# Patient Record
Sex: Male | Born: 1980 | Race: Black or African American | Hispanic: No | Marital: Married | Smoking: Never smoker
Health system: Southern US, Community
[De-identification: ages and names within clinical notes are randomized; demographics above are authoritative.]

## PROBLEM LIST (undated history)

## (undated) DIAGNOSIS — K1120 Sialoadenitis, unspecified: Secondary | ICD-10-CM

## (undated) HISTORY — PX: NO PAST SURGERIES: SHX2092

## (undated) HISTORY — DX: Sialoadenitis, unspecified: K11.20

---
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
## 2004-10-13 ENCOUNTER — Emergency Department (HOSPITAL_COMMUNITY): Admission: EM | Admit: 2004-10-13 | Discharge: 2004-10-14 | Payer: Self-pay | Admitting: Emergency Medicine

## 2008-09-23 ENCOUNTER — Emergency Department (HOSPITAL_COMMUNITY): Admission: EM | Admit: 2008-09-23 | Discharge: 2008-09-24 | Payer: Self-pay | Admitting: Emergency Medicine

## 2011-04-10 ENCOUNTER — Emergency Department (HOSPITAL_COMMUNITY): Payer: Self-pay

## 2011-04-10 ENCOUNTER — Emergency Department (HOSPITAL_COMMUNITY)
Admission: EM | Admit: 2011-04-10 | Discharge: 2011-04-10 | Disposition: A | Discharge status: Home / Self Care | Payer: Self-pay | Attending: Emergency Medicine | Admitting: Emergency Medicine

## 2011-04-10 DIAGNOSIS — W219XXA Striking against or struck by unspecified sports equipment, initial encounter: Secondary | ICD-10-CM | POA: Insufficient documentation

## 2011-04-10 DIAGNOSIS — S6980XA Other specified injuries of unspecified wrist, hand and finger(s), initial encounter: Secondary | ICD-10-CM | POA: Insufficient documentation

## 2011-04-10 DIAGNOSIS — Y9367 Activity, basketball: Secondary | ICD-10-CM | POA: Insufficient documentation

## 2011-04-10 DIAGNOSIS — M7989 Other specified soft tissue disorders: Secondary | ICD-10-CM | POA: Insufficient documentation

## 2011-04-10 DIAGNOSIS — M20029 Boutonniere deformity of unspecified finger(s): Secondary | ICD-10-CM | POA: Insufficient documentation

## 2011-04-10 DIAGNOSIS — M79609 Pain in unspecified limb: Secondary | ICD-10-CM | POA: Insufficient documentation

## 2011-04-10 DIAGNOSIS — S6990XA Unspecified injury of unspecified wrist, hand and finger(s), initial encounter: Secondary | ICD-10-CM | POA: Insufficient documentation

## 2011-04-10 DIAGNOSIS — Y9239 Other specified sports and athletic area as the place of occurrence of the external cause: Secondary | ICD-10-CM | POA: Insufficient documentation

## 2011-11-28 IMAGING — CR DG FINGER MIDDLE 2+V*L*
3 series · 3 of 3 positions shown · non-contrast
Comparison: None.

CLINICAL DATA: Injured finger

LEFT MIDDLE FINGER 2+V

[x finger pa left]
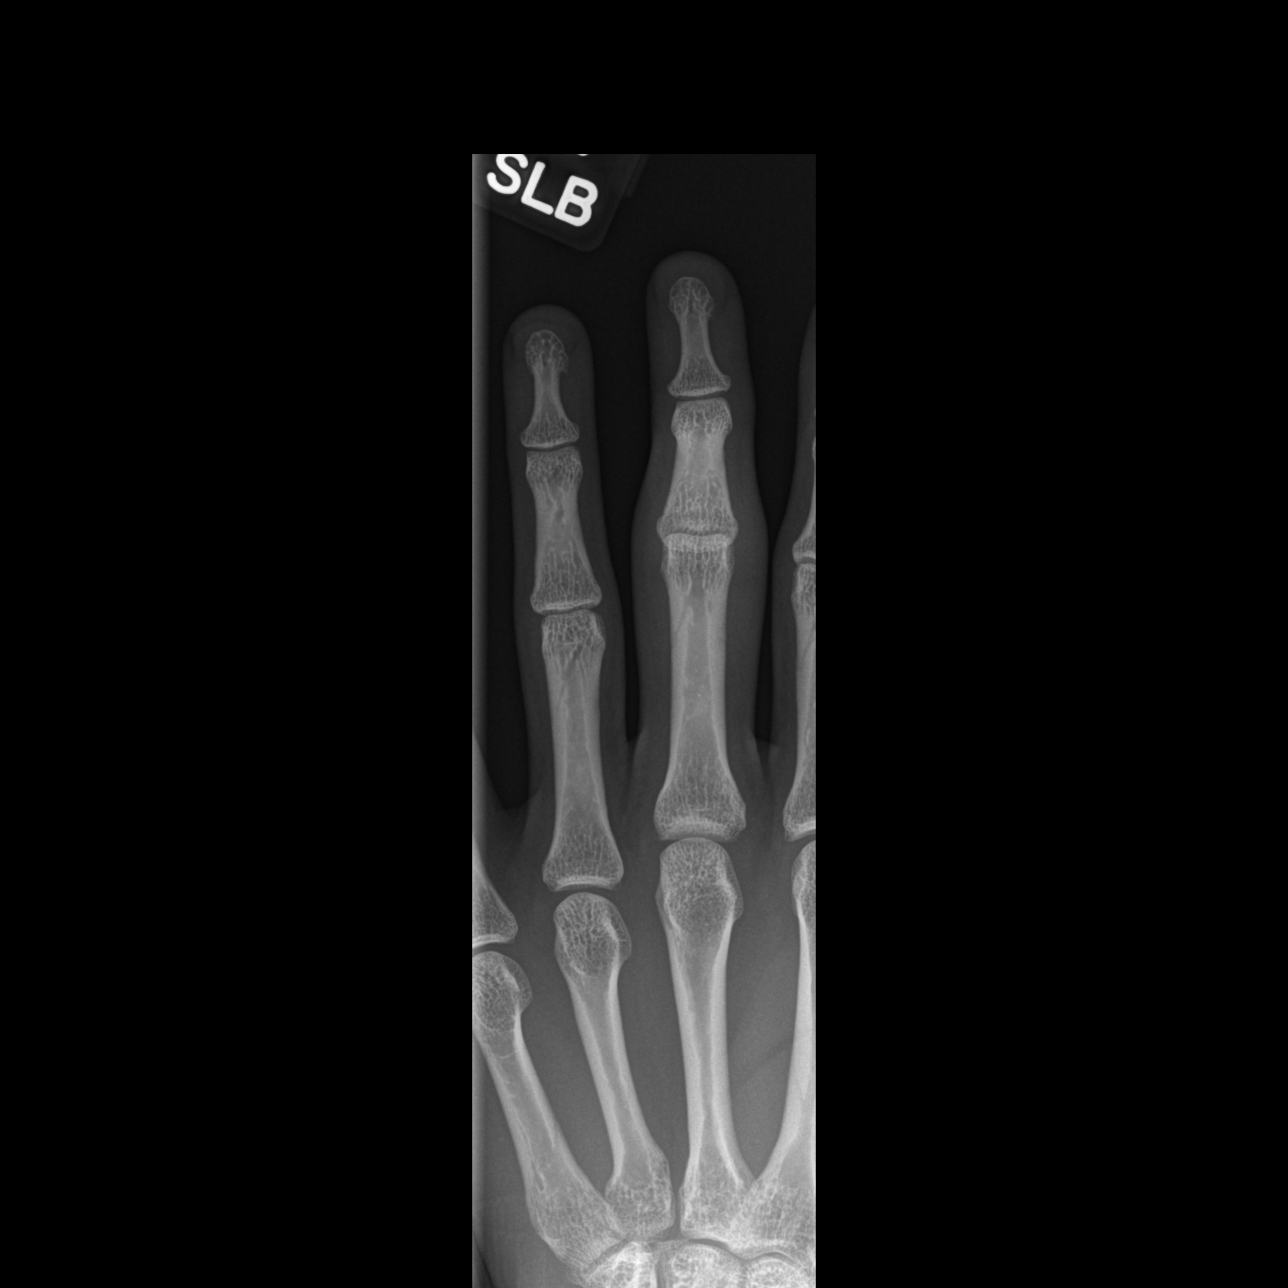

[x finger obl. left]
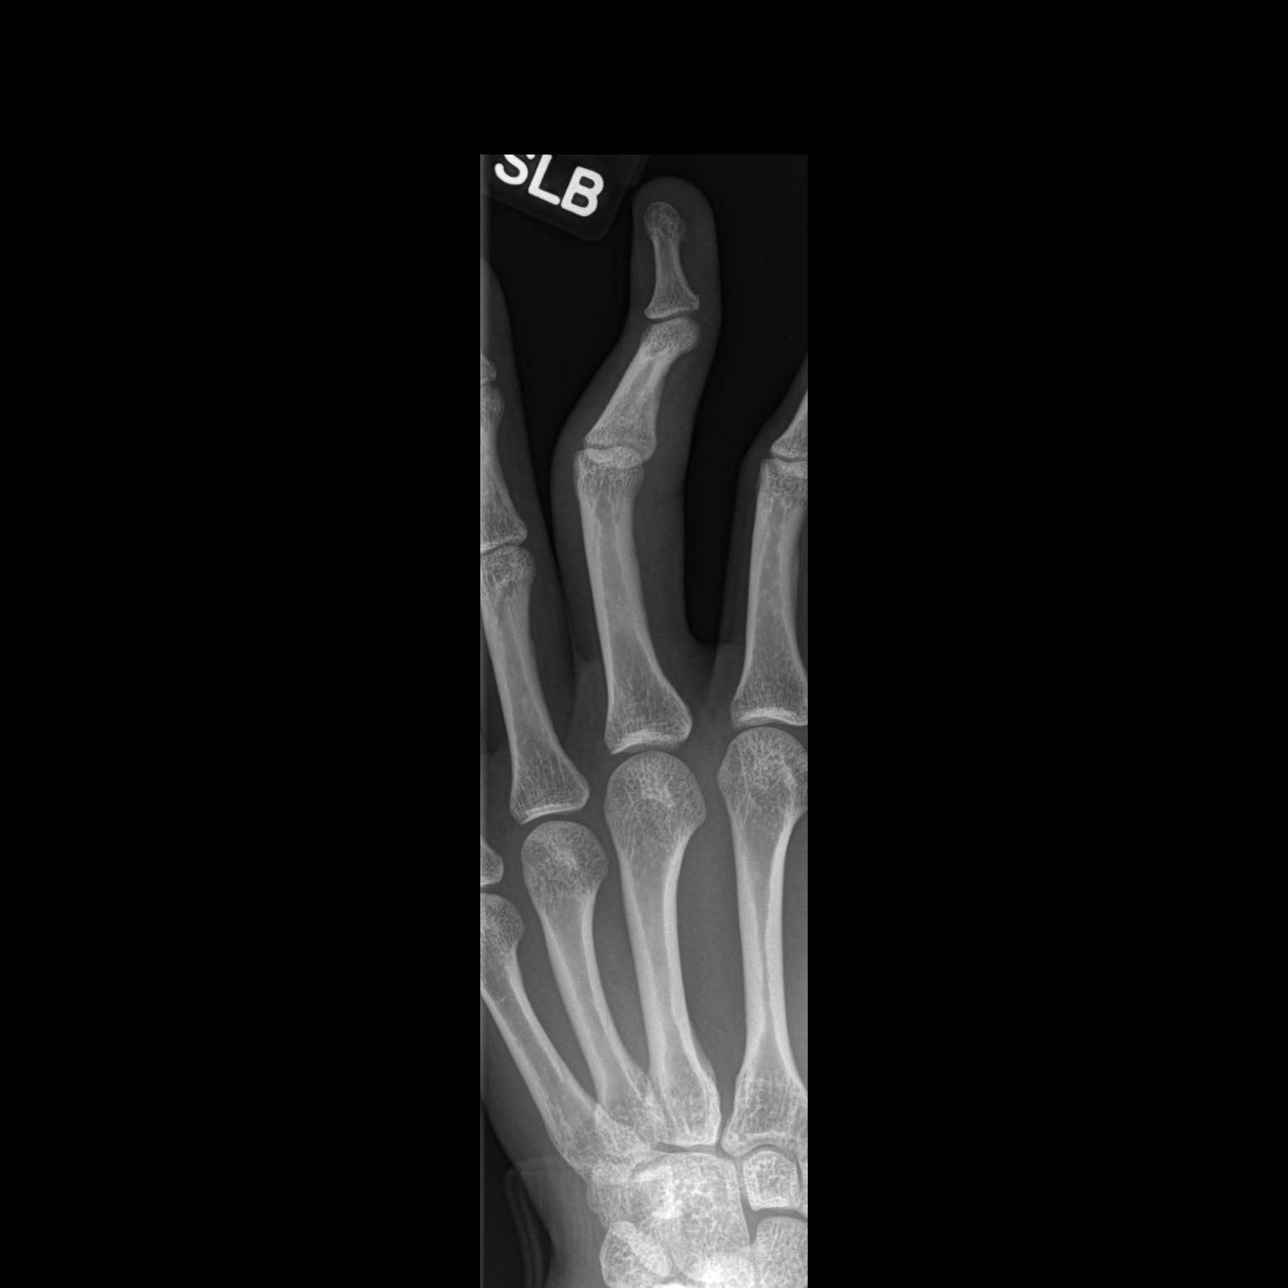

[x finger lateral left]
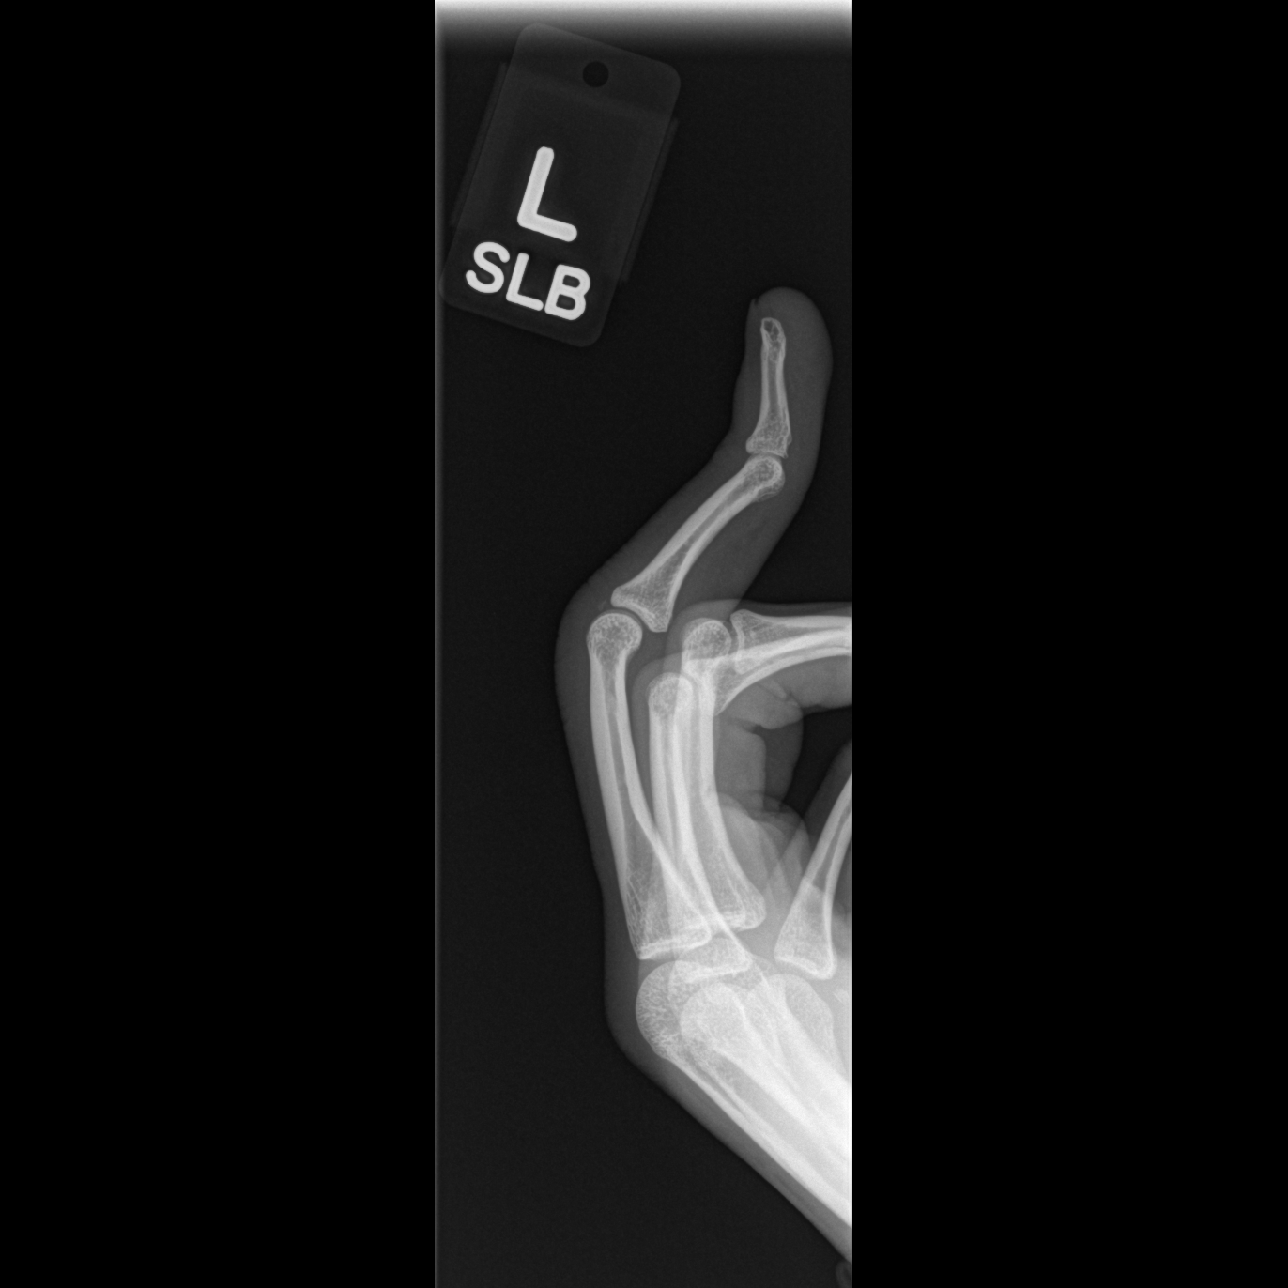

[3 of 3 positions shown; findings below may reference images not displayed]

FINDINGS: No acute fracture is seen.  No dislocation is noted.
However the appearance suggests Boutonnier deformity indicating
ligamentous injury.  Joint spaces appear normal.
IMPRESSION: No fracture.  Probable Boutonnier deformity.

## 2015-06-28 ENCOUNTER — Other Ambulatory Visit: Payer: Self-pay | Admitting: Occupational Medicine

## 2015-06-28 ENCOUNTER — Ambulatory Visit: Payer: Self-pay

## 2015-06-28 DIAGNOSIS — Z0000 Encounter for general adult medical examination without abnormal findings: Secondary | ICD-10-CM

## 2015-10-22 ENCOUNTER — Emergency Department (HOSPITAL_COMMUNITY)
Admission: EM | Admit: 2015-10-22 | Discharge: 2015-10-22 | Disposition: A | Discharge status: Home / Self Care | Payer: Self-pay | Attending: Emergency Medicine | Admitting: Emergency Medicine

## 2015-10-22 ENCOUNTER — Encounter (HOSPITAL_COMMUNITY): Payer: Self-pay | Admitting: Cardiology

## 2015-10-22 DIAGNOSIS — J029 Acute pharyngitis, unspecified: Secondary | ICD-10-CM

## 2015-10-22 DIAGNOSIS — B349 Viral infection, unspecified: Secondary | ICD-10-CM | POA: Insufficient documentation

## 2015-10-22 DIAGNOSIS — Z720 Tobacco use: Secondary | ICD-10-CM | POA: Insufficient documentation

## 2015-10-22 LAB — RAPID STREP SCREEN (MED CTR MEBANE ONLY): Streptococcus, Group A Screen (Direct): NEGATIVE

## 2015-10-22 MED ORDER — MELOXICAM 15 MG PO TABS: 15.0000 mg | ORAL_TABLET | Freq: Every day | ORAL | Status: DC

## 2015-10-22 NOTE — ED Notes (Signed)
Reports sore throat and swelling for the past couple of days.

## 2015-10-22 NOTE — Discharge Instructions (Signed)
Take mobic as needed for pain. Refer to attached documents for more information. Return to the ED with worsening or concerning symptoms.  °

## 2015-10-22 NOTE — ED Provider Notes (Signed)
CSN: [id number]     Arrival date & time 10/22/15  [id number] History  By signing my name below, I, Essence Howell, attest that this documentation has been prepared under the direction and in the presence of Emilia BeckKaitlyn Khylei Wilms, PA-C Electronically Signed: Charline BillsEssence Howell, ED Scribe 10/22/2015 at 11:42 AM.   Chief Complaint  Patient presents with  . Sore Throat   The history is provided by the patient. No language interpreter was used.   HPI Comments: Charles Schultz is a 34 y.o. male who presents to the Emergency Department complaining of intermittent sore throat for the past few weeks, persistent for the past few days. Pt reports associated cervical adenopathy that is tender to palpation. He has tried Tylenol with mild relief. Pt denies fever and cough. No sick contacts.   History reviewed. No pertinent past medical history. History reviewed. No pertinent past surgical history. History reviewed. No pertinent family history. Social History  Substance Use Topics  . Smoking status: Current Every Day Smoker  . Smokeless tobacco: None  . Alcohol Use: Yes    Review of Systems  Constitutional: Negative for fever.  HENT: Positive for sore throat.   Respiratory: Negative for cough.   All other systems reviewed and are negative.  Allergies  Review of patient's allergies indicates no known allergies.  Home Medications   Prior to Admission medications   Not on File   BP 124/66 mmHg  Pulse 60  Temp(Src) 98 F (36.7 C)  Resp 18  Ht 5\' 7"  (1.702 m)  Wt 155 lb (70.308 kg)  BMI 24.27 kg/m2  SpO2 99% Physical Exam  Constitutional: He is oriented to person, place, and time. He appears well-developed and well-nourished. No distress.  HENT:  Head: Normocephalic and atraumatic.  Mouth/Throat: Posterior oropharyngeal erythema (mild) present. No oropharyngeal exudate or posterior oropharyngeal edema.  Eyes: Conjunctivae and EOM are normal.  Neck: Neck supple. No tracheal deviation present.   Cardiovascular: Normal rate.   Pulmonary/Chest: Effort normal. No respiratory distress.  Abdominal: Soft. He exhibits no distension. There is no tenderness. There is no rebound.  Musculoskeletal: Normal range of motion.  Lymphadenopathy:    He has cervical adenopathy (L; tender to palpation).  Neurological: He is alert and oriented to person, place, and time.  Skin: Skin is warm and dry.  Psychiatric: He has a normal mood and affect. His behavior is normal.  Nursing note and vitals reviewed.  ED Course  Procedures (including critical care time) DIAGNOSTIC STUDIES: Oxygen Saturation is 99% on RA, normal by my interpretation.    COORDINATION OF CARE: 10:39 AM-Discussed treatment plan which includes Mobic, strep screen and culture with pt at bedside and pt agreed to plan.   Labs Review Labs Reviewed  RAPID STREP SCREEN (NOT AT [facility])  CULTURE, GROUP A STREP   Imaging Review No results found. I have personally reviewed and evaluated these images and lab results as part of my medical decision-making.   EKG Interpretation None      MDM   Final diagnoses:  Viral illness  Sore throat    Patient's rapid strep negative. Patient will be discharged with mobic for pain. No signs of acute infection. Vitals stable and patient afebrile.   I personally performed the services described in this documentation, which was scribed in my presence. The recorded information has been reviewed and is accurate.    Emilia BeckKaitlyn Delvon Chipps, PA-C 10/24/15 0353  Tilden FossaElizabeth Rees, MD 10/24/15 [phone]

## 2015-10-24 LAB — CULTURE, GROUP A STREP: STREP A CULTURE: NEGATIVE

## 2015-10-25 ENCOUNTER — Emergency Department (HOSPITAL_COMMUNITY)
Admission: EM | Admit: 2015-10-25 | Discharge: 2015-10-25 | Disposition: A | Discharge status: Home / Self Care | Payer: Self-pay | Attending: Emergency Medicine | Admitting: Emergency Medicine

## 2015-10-25 ENCOUNTER — Encounter (HOSPITAL_COMMUNITY): Payer: Self-pay | Admitting: *Deleted

## 2015-10-25 DIAGNOSIS — Z720 Tobacco use: Secondary | ICD-10-CM | POA: Insufficient documentation

## 2015-10-25 DIAGNOSIS — Z791 Long term (current) use of non-steroidal anti-inflammatories (NSAID): Secondary | ICD-10-CM | POA: Insufficient documentation

## 2015-10-25 DIAGNOSIS — J029 Acute pharyngitis, unspecified: Secondary | ICD-10-CM | POA: Insufficient documentation

## 2015-10-25 MED ORDER — LIDOCAINE VISCOUS 2 % MT SOLN: 20.0000 mL | OROMUCOSAL | Status: DC | PRN

## 2015-10-25 MED ORDER — PENICILLIN G BENZATHINE 1200000 UNIT/2ML IM SUSP
1.2000 10*6.[IU] | Freq: Once | INTRAMUSCULAR | Status: AC
  Administered 2015-10-25: 1.2 10*6.[IU] via INTRAMUSCULAR
  Filled 2015-10-25: qty 2

## 2015-10-25 MED ORDER — DEXAMETHASONE SODIUM PHOSPHATE 10 MG/ML IJ SOLN
10.0000 mg | Freq: Once | INTRAMUSCULAR | Status: AC
  Administered 2015-10-25: 10 mg via INTRAMUSCULAR
  Filled 2015-10-25: qty 1

## 2015-10-25 NOTE — Discharge Instructions (Signed)
Pharyngitis °Pharyngitis is redness, pain, and swelling (inflammation) of your pharynx.  °CAUSES  °Pharyngitis is usually caused by infection. Most of the time, these infections are from viruses (viral) and are part of a cold. However, sometimes pharyngitis is caused by bacteria (bacterial). Pharyngitis can also be caused by allergies. Viral pharyngitis may be spread from person to person by coughing, sneezing, and personal items or utensils (cups, forks, spoons, toothbrushes). Bacterial pharyngitis may be spread from person to person by more intimate contact, such as kissing.  °SIGNS AND SYMPTOMS  °Symptoms of pharyngitis include:   °· Sore throat.   °· Tiredness (fatigue).   °· Low-grade fever.   °· Headache. °· Joint pain and muscle aches. °· Skin rashes. °· Swollen lymph nodes. °· Plaque-like film on throat or tonsils (often seen with bacterial pharyngitis). °DIAGNOSIS  °Your health care provider will ask you questions about your illness and your symptoms. Your medical history, along with a physical exam, is often all that is needed to diagnose pharyngitis. Sometimes, a rapid strep test is done. Other lab tests may also be done, depending on the suspected cause.  °TREATMENT  °Viral pharyngitis will usually get better in 3-4 days without the use of medicine. Bacterial pharyngitis is treated with medicines that kill germs (antibiotics).  °HOME CARE INSTRUCTIONS  °· Drink enough water and fluids to keep your urine clear or pale yellow.   °· Only take over-the-counter or prescription medicines as directed by your health care provider:   °¨ If you are prescribed antibiotics, make sure you finish them even if you start to feel better.   °¨ Do not take aspirin.   °· Get lots of rest.   °· Gargle with 8 oz of salt water (½ tsp of salt per 1 qt of water) as often as every 1-2 hours to soothe your throat.   °· Throat lozenges (if you are not at risk for choking) or sprays may be used to soothe your throat. °SEEK MEDICAL  CARE IF:  °· You have large, tender lumps in your neck. °· You have a rash. °· You cough up green, yellow-brown, or bloody spit. °SEEK IMMEDIATE MEDICAL CARE IF:  °· Your neck becomes stiff. °· You drool or are unable to swallow liquids. °· You vomit or are unable to keep medicines or liquids down. °· You have severe pain that does not go away with the use of recommended medicines. °· You have trouble breathing (not caused by a stuffy nose). °MAKE SURE YOU:  °· Understand these instructions. °· Will watch your condition. °· Will get help right away if you are not doing well or get worse. °  °This information is not intended to replace advice given to you by your health care provider. Make sure you discuss any questions you have with your health care provider. °  °Document Released: 12/15/2005 Document Revised: 10/05/2013 Document Reviewed: 08/22/2013 °Elsevier Interactive Patient Education ©2016 Elsevier Inc. ° °Emergency Department Resource Guide °1) Find a Doctor and Pay Out of Pocket °Although you won't have to find out who is covered by your insurance plan, it is a good idea to ask around and get recommendations. You will then need to call the office and see if the doctor you have chosen will accept you as a new patient and what types of options they offer for patients who are self-pay. Some doctors offer discounts or will set up payment plans for their patients who do not have insurance, but you will need to ask so you aren't surprised when you get   to your appointment. ° °2) Contact Your Local Health Department °Not all health departments have doctors that can see patients for sick visits, but many do, so it is worth a call to see if yours does. If you don't know where your local health department is, you can check in your phone book. The CDC also has a tool to help you locate your state's health department, and many state websites also have listings of all of their local health departments. ° °3) Find a  Walk-in Clinic °If your illness is not likely to be very severe or complicated, you may want to try a walk in clinic. These are popping up all over the country in pharmacies, drugstores, and shopping centers. They're usually staffed by nurse practitioners or physician assistants that have been trained to treat common illnesses and complaints. They're usually fairly quick and inexpensive. However, if you have serious medical issues or chronic medical problems, these are probably not your best option. ° °No Primary Care Doctor: °- Call [facility] at  832-8000 - they can help you locate a primary care doctor that  accepts your insurance, provides certain services, etc. °- Physician Referral Service- 1-800-533-3463 ° °Chronic Pain Problems: °Organization         Address  Phone   Notes  °[facility]  (336) 297-2271 Patients need to be referred by their primary care doctor.  ° °Medication Assistance: °Organization         Address  Phone   Notes  °[place] [facility] [address] °[place], [place] [address] (336) 641-8030 --Must be a resident of [place] °-- Must have NO insurance coverage whatsoever (no Medicaid/ Medicare, etc.) °-- The pt. MUST have a primary care doctor that directs their care regularly and follows them in the community °  °[facility]  (866) 331-1348   °[employer]  (888) 892-1162   ° °Agencies that provide inexpensive medical care: °Organization         Address  Phone   Notes  °[facility]  (336) 832-8035   °[facility]    (336) 832-7272   °[facility] [address] °[place], [place] [address] (336) 832-4777   °[facility] [address], °[place] (336) 271-4999   °[facility]    (336) 373-0678   °[facility]    (336) 272-1050   °[facility] ° [address], [place] Phone:  (336) 832-4444, Fax:  (336)  832-4440 Hours of Operation:  9 am - 6 pm, M-F.  Also accepts Medicaid/Medicare and self-pay.  °[facility] ° [address], [place] Phone: (336) 832-3150, Fax: (336) 832-3151. Hours of Operation:  8:30 am - 5:30 pm, M-F.  Also accepts Medicaid and self-pay.  °[facility] [place] [address], [place] Phone: (336) 878-6027   °[facility] [address], [place], [place] (336)723-1848, Ext. 123 Mondays & Thursdays: 7-9 AM.  First 15 patients are seen on a first come, first serve basis. °  ° °Medicaid-accepting [place] Providers: ° °Organization         Address  Phone   Notes  °[facility] [address], [place] (336) 641-2100 Also accepts self-pay patients.  °[facility] [address], [place] ° (336) 856-9996   °[facility] [address], [place] (336) 288-8857   °[facility] [address] [place] [address], [place] (336) 299-7000   °Veita Bland [address], [place]  ° (336) 373-1557 Only accepts [place] Access Medicaid patients after they have their name applied to their card.  ° °Self-Pay (no insurance) in [place]: ° °Organization         Address  Phone   Notes  °Sickle Cell Patients, [facility] [address], [place] (336) 832-1970   °[facility] [address], [place] (336) 832-4400   °[facility] ° [address], [place] (336) 992-4800   °[facility]/Dr. Osei-Bonsu ° [address] [place] [address], [place] or [address], [place] (336) 841-8500 Phone number for both [place] and [place] locations is the same.  °[facility] [address], [place] (336) 299-0000   °[facility] [address] [place] [address], [place] or [address] (336) 852-7530 °(336) 878-2260     °[facility] [address], [place] (336) 350-1642, phone; (336) 294-5005, fax Sees patients 1st and 3rd Saturday of every month.  Must not qualify for public or private insurance (i.e. Medicaid, Medicare, [place] Health Choice, Veterans' Benefits) • Household income should be no more than 200% of the poverty level •The clinic cannot treat you if you are pregnant or think you are pregnant • Sexually transmitted diseases are not treated at the clinic.  ° ° °Dental Care: °Organization         Address  Phone  Notes  °[facility] [address], [place] (336) 641-6152 Accepts children up to age 21 who are enrolled in Medicaid or [place] Health Choice; pregnant women with a Medicaid card; and children who have applied for Medicaid or [place] Health Choice, but were declined, whose parents can pay a reduced fee at time of service.  °[facility] [place]  [address], [place] (336) 641-7733 Accepts children up to age 21 who are enrolled in Medicaid or [place] Health Choice; pregnant women with a Medicaid card; and children who have applied for Medicaid or [place] Health Choice, but were declined, whose parents can pay a reduced fee at time of service.  °[facility] ° [address], [place] (336) 641-4533 Patients are seen by appointment only. Walk-ins are not accepted. [facility] will see patients 18 years of age and older. °Monday - Tuesday (8am-5pm) °Most Wednesdays (8:30-5pm) °$30 per visit, cash only  °[facility] ° [address], [place] (336) 641-4533 Patients are seen by appointment only. Walk-ins are not accepted. [facility] will see patients 18 years of age and older. °One Wednesday Evening (Monthly: Volunteer Based).  $30 per visit, cash only  °[facility]  (919) 537-3737 for adults; Children under age 4, call  Grad[facility] Pediatric [facility] at ([phone]) [phone]. Children aged 4-14, please call (919) [phone] to request a pediatric application. ° Dental services are provided in all areas of dental care including fillings, crowns and bridges, complete and partial dentures, implants, gum treatment, root canals, and extractions. Preventive care is also provided. Treatment is provided to both adults and children. °Patients are selected via a lottery and there is often a waiting list. °  °[facility] [address], °[place] ° (336) 763-8833 [email or web address] °  °[facility] [address], [place], [place] (336)723-1848, Ext. 123 Second and Fourth Thursday of each month, opens at 6:30 AM; Clinic ends at 9 AM.  Patients are seen on a first-come first-served basis, and a limited number are seen during each clinic.  ° °[facility] ° [address], [place], [place] (336) 723-7904   Eligibility Requirements °You must have lived in [place], [place], or [place] counties for at least the last three months. °  You cannot be eligible for state or federal sponsored healthcare insurance, including Veterans Administration, Medicaid, or Medicare. °  You generally cannot be eligible for healthcare insurance through your employer.  °  How to apply: °Eligibility screenings are held every Tuesday and Wednesday afternoon from 1:00 pm until 4:00 pm. You do not need an appointment for the interview!  °[facility] [address], [place], [place] 336-631-2330   °[facility]  336-342-8273   °[facility]  336-703-3100   °[facility]  336-570-6415   ° °Behavioral Health Resources in the Community: °Intensive Outpatient Programs °Organization         Address  Phone  Notes  °[facility] [address], [place], [place] 336-878-6098   °[facility] [address], [place], [place]  336-832-9800   °[facility] [address], [place], [place] ° 336-882-2125   °[facility] [address],  °[place], [place] 1-800-853-5163 or 336-641-4981   °Substance Abuse Resources °Organization         Address  Phone  Notes  °[facility]  336-882-2125   °[facility]  336-784-9470   °[facility]  336-285-9073   °[facility]  336-845-3988   °Residential & Outpatient Substance Abuse Program  1-800-659-3381   °Psychological Services °Organization         Address  Phone  Notes  °[facility]  336- 832-9600   °[facility]  336- 378-7881   °[facility] [address], [place] 1-800-853-5163 or 336-641-4981   ° °Mobile Crisis Teams °Organization         Address  Phone  Notes  °[facility], Mobile Crisis Care Unit  1-877-626-1772   °Assertive °[facility] ° [address] [place], [place] 336-834-9664   °Sharon DeEsch [address] °[place] 336-554-5454   ° °Self-Help/Support Groups °Organization         Address  Phone             Notes  °[facility] [place] - variety of support groups  336- 373-1402 Call for more information  °Narcotics Anonymous (NA), [facility] [address], °[place]  2 meetings at this location  ° °Residential Treatment Programs °Organization         Address  Phone  Notes  °[facility] [address],    °[place]  1-866-801-8205   °[facility] ° [address], [place], [place] 704-293-8524   °[facility] [address], [place] 336-845-3988 Admissions: 8am-3pm M-F  °[facility] [address]    °[place],  336-841-1104   °[facility] [address], [place],  336-379-7146   °[facility] [address]  °[place],   [place] 336-285-9073   °[facility] - Intensive Outpatient [address], [place], [place] 336-852-3033   °[facility] [address]  °[place], [place] 1-877-615-2722 or 336-784-9470   °[facility] [address] [place], [place] 336-227-7417 Accepts Medicaid  °[facility] [address]  °[place] 1-800-659-3381 Substance Abuse/Addiction Treatment  ° °[place] Behavioral Health Resources °Organization         Address  Phone  Notes  °[facility]  (888) 581-9988   °Julie Brannon, PhD [address] [place], [place]   (336) 349-5553 or (336) 951-0000   °[facility]   [address] °[place], [place] (336) 349-4454   °[facility] [address], [place], [place] (336) 342-8316 Insurance/Medicaid/sponsorship through [facility]  °[facility] [address]                                    [place], [place] (336) 342-8316 Therapy/tele-psych/case  °[facility] [address]  ° [place], [place] (336) 349-2233    °Dr. Arfeen  (336) 349-4544   °[facility] [place]  [employer] [place] [facility] 1) [address], [place] °2) [address], [place] °3)  [address], [place] (336) 349-3220 °(336) 342-7768 ° °(336) 342-8140   °[place] [facility] (336) 342-1394 or (336) 342-3537 (After Hours)    ° ° ° °

## 2015-10-25 NOTE — ED Notes (Signed)
Patient here for follow up visit for sore throat, patient states he was seen her for same and symptoms have not improved. Denies fever. Report pain with chewing and moving tongue. Had tooth removed last Tuesday to left upper back.

## 2015-10-25 NOTE — ED Notes (Signed)
Patient is alert and orientedx4.  Patient was explained discharge instructions and they understood them with no questions.   

## 2015-10-25 NOTE — ED Provider Notes (Signed)
CSN: [id number]     Arrival date & time 10/25/15  1508 History  By signing my name below, I, Soijett Blue, attest that this documentation has been prepared under the direction and in the presence of Catha GosselinHanna Patel-Mills, PA-C Electronically Signed: Soijett Blue, ED Scribe. 10/25/2015. 3:47 PM.   Chief Complaint  Patient presents with  . Sore Throat      The history is provided by the patient. No language interpreter was used.    Charles Schultz is a 34 y.o. male who presents to the Emergency Department complaining of worsening sore throat onset 2 weeks worsening over the past week. He notes that his glands are swollen and he does not have strep throat, but he has had issues with his glands for years. He reports that his glands have discharge and that when he taste it, it tastes horrible. He reports that he was seen in the ED on 10/22/15 to which he had a negative strep. He states that he has not tried any medications for the relief for his symptoms. He denies fever and any other symptoms. He reports that he is a smoker and he smokes a lot of cigarettes. He denies any fever, facial swelling, cough, or shortness of breath.   History reviewed. No pertinent past medical history. History reviewed. No pertinent past surgical history. History reviewed. No pertinent family history. Social History  Substance Use Topics  . Smoking status: Current Every Day Smoker  . Smokeless tobacco: None  . Alcohol Use: Yes    Review of Systems  Constitutional: Negative for fever.  HENT: Positive for sore throat. Negative for trouble swallowing and voice change.   Skin: Negative for color change, rash and wound.      Allergies  Review of patient's allergies indicates no known allergies.  Home Medications   Prior to Admission medications   Medication Sig Start Date End Date Taking? Authorizing Provider  lidocaine (XYLOCAINE) 2 % solution Use as directed 20 mLs in the mouth or throat as needed for mouth  pain. 10/25/15   Cecile Guevara Patel-Mills, PA-C  meloxicam (MOBIC) 15 MG tablet Take 1 tablet (15 mg total) by mouth daily. 10/22/15   Kaitlyn Szekalski, PA-C   BP 119/86 mmHg  Pulse 68  Temp(Src) 98.3 F (36.8 C) (Oral)  Resp 18  SpO2 99% Physical Exam  Constitutional: He is oriented to person, place, and time. He appears well-developed and well-nourished. No distress.  HENT:  Head: Normocephalic and atraumatic.  Mouth/Throat: No trismus in the jaw.  Tonsillar erythema and edema L>R. No kissing tonsils and uvula is midline. No drooling or trismus. No hot potato voice.   Eyes: EOM are normal.  Neck: Neck supple.  Cardiovascular: Normal rate, regular rhythm and normal heart sounds.  Exam reveals no gallop and no friction rub.   No murmur heard. Pulmonary/Chest: Effort normal and breath sounds normal. No respiratory distress. He has no wheezes. He has no rales.  Lungs clear to ausculation bilaterally. No wheezing.  Musculoskeletal: Normal range of motion.  Lymphadenopathy:    He has cervical adenopathy.  Bilateral anterior cervical LAD.   Neurological: He is alert and oriented to person, place, and time.  Skin: Skin is warm and dry.  Psychiatric: He has a normal mood and affect. His behavior is normal.  Nursing note and vitals reviewed.   ED Course  Procedures (including critical care time) DIAGNOSTIC STUDIES: Oxygen Saturation is 99% on RA, nl by my interpretation.    COORDINATION OF CARE:  3:42 PM Discussed treatment plan with pt at bedside which includes penicillin and decadron and pt agreed to plan.  Labs Review Labs Reviewed - No data to display  Imaging Review No results found.   EKG Interpretation None      MDM   Final diagnoses:  Pharyngitis  Patient presents for sore throat that has worsened in the last few days. Medications  dexamethasone (DECADRON) injection 10 mg (not administered)  penicillin g benzathine (BICILLIN LA) 1200000 UNIT/2ML injection 1.2 Million  Units (not administered)  Pt was here on 10/22/15 with negative strep. [facility] requirements reviewed and I believe that the pt will benefit from abx, decadron, and viscous lidocaine. I have given him return precautions as well as follow up and he has verbally agreed with the plan. Filed Vitals:   10/25/15 1534  BP: 119/86  Pulse: 68  Temp: 98.3 F (36.8 C)  Resp: 18   I personally performed the services described in this documentation, which was scribed in my presence. The recorded information has been reviewed and is accurate.     Catha Gosselin, PA-C 10/25/15 1553  Tilden Fossa, MD 10/26/15 1257

## 2015-12-25 ENCOUNTER — Emergency Department (HOSPITAL_COMMUNITY)
Admission: EM | Admit: 2015-12-25 | Discharge: 2015-12-25 | Disposition: A | Discharge status: Home / Self Care | Payer: PRIVATE HEALTH INSURANCE | Attending: Physician Assistant | Admitting: Physician Assistant

## 2015-12-25 ENCOUNTER — Encounter (HOSPITAL_COMMUNITY): Payer: Self-pay | Admitting: Emergency Medicine

## 2015-12-25 DIAGNOSIS — M778 Other enthesopathies, not elsewhere classified: Secondary | ICD-10-CM | POA: Diagnosis not present

## 2015-12-25 DIAGNOSIS — M25521 Pain in right elbow: Secondary | ICD-10-CM | POA: Diagnosis present

## 2015-12-25 DIAGNOSIS — F17200 Nicotine dependence, unspecified, uncomplicated: Secondary | ICD-10-CM | POA: Diagnosis not present

## 2015-12-25 DIAGNOSIS — Z791 Long term (current) use of non-steroidal anti-inflammatories (NSAID): Secondary | ICD-10-CM | POA: Diagnosis not present

## 2015-12-25 MED ORDER — ONDANSETRON HCL 4 MG PO TABS
4.0000 mg | ORAL_TABLET | Freq: Once | ORAL | Status: AC
  Administered 2015-12-25: 4 mg via ORAL
  Filled 2015-12-25: qty 1

## 2015-12-25 MED ORDER — PREDNISONE 20 MG PO TABS
60.0000 mg | ORAL_TABLET | Freq: Once | ORAL | Status: AC
  Administered 2015-12-25: 60 mg via ORAL
  Filled 2015-12-25: qty 3

## 2015-12-25 MED ORDER — KETOROLAC TROMETHAMINE 10 MG PO TABS
10.0000 mg | ORAL_TABLET | Freq: Once | ORAL | Status: AC
  Administered 2015-12-25: 10 mg via ORAL
  Filled 2015-12-25: qty 1

## 2015-12-25 MED ORDER — DICLOFENAC SODIUM 75 MG PO TBEC: 75.0000 mg | DELAYED_RELEASE_TABLET | Freq: Two times a day (BID) | ORAL | Status: DC

## 2015-12-25 MED ORDER — DEXAMETHASONE 6 MG PO TABS
ORAL_TABLET | ORAL | Status: DC
Start: 2015-12-25 — End: 2017-12-31

## 2015-12-25 NOTE — ED Notes (Signed)
Pt states that he has had nontraumatic rt elbow pain x 4 months.

## 2015-12-25 NOTE — Discharge Instructions (Signed)
Your examination suggested tendinitis involving the right elbow. Please use the sling. Please use diclofenac and Decadron daily with food. Please see the orthopedic specialist listed above if not improving.

## 2015-12-25 NOTE — ED Provider Notes (Signed)
CSN: [id number]     Arrival date & time 12/25/15  1349 History  By signing my name below, I, Charles Schultz, attest that this documentation has been prepared under the direction and in the presence of Ivery Quale, PA-C Electronically Signed: Charline Bills, ED Scribe 12/25/2015 at 4:39 PM.   Chief Complaint  Patient presents with  . Arm Pain   Patient is a 34 y.o. male presenting with arm pain. The history is provided by the patient. No language interpreter was used.  Arm Pain This is a new problem. The current episode started more than 1 week ago. The problem has been gradually worsening. The symptoms are aggravated by bending. Nothing relieves the symptoms. He has tried a warm compress and a cold compress for the symptoms. The treatment provided no relief.   HPI Comments: Charles Schultz is a 34 y.o. male who presents to the Emergency Department complaining of constant, gradually worsening right elbow pain onset 4 months ago. Pt initially noticed pain while at work 4 months ago. Pain is exacrbated with lifting a glass of water, squeezing and extension. Pt reports increased pain at night and intermittent swelling to the area. No known injury. He has tried ice and heat without significant relief.   History reviewed. No pertinent past medical history. No past surgical history on file. No family history on file. Social History  Substance Use Topics  . Smoking status: Current Every Day Smoker  . Smokeless tobacco: None  . Alcohol Use: Yes    Review of Systems  Musculoskeletal: Positive for joint swelling and arthralgias.  All other systems reviewed and are negative.  Allergies  Review of patient's allergies indicates no known allergies.  Home Medications   Prior to Admission medications   Medication Sig Start Date End Date Taking? Authorizing Provider  lidocaine (XYLOCAINE) 2 % solution Use as directed 20 mLs in the mouth or throat as needed for mouth pain. 10/25/15   Hanna  Patel-Mills, PA-C  meloxicam (MOBIC) 15 MG tablet Take 1 tablet (15 mg total) by mouth daily. 10/22/15   Kaitlyn Szekalski, PA-C   BP 104/62 mmHg  Pulse 62  Temp(Src) 98.1 F (36.7 C) (Oral)  Resp 16  SpO2 99% Physical Exam  Constitutional: He is oriented to person, place, and time. He appears well-developed and well-nourished. No distress.  HENT:  Head: Normocephalic and atraumatic.  Eyes: Conjunctivae and EOM are normal.  Neck: Neck supple. No tracheal deviation present.  Cardiovascular: Normal rate.   Pulmonary/Chest: Effort normal. No respiratory distress.  Musculoskeletal: Normal range of motion.  Pain with ROM of the R wrist. Full ROM of the fingers of the R hand. No effusion of the elbow. Pain over the lateral condyle on the R. No deformity of the bicep/tricep area. No evidence of shoulder dislocation.   Neurological: He is alert and oriented to person, place, and time.  Skin: Skin is warm and dry.  Psychiatric: He has a normal mood and affect. His behavior is normal.  Nursing note and vitals reviewed.  ED Course  Procedures (including critical care time) DIAGNOSTIC STUDIES: Oxygen Saturation is 99% on RA, normal by my interpretation.    COORDINATION OF CARE: 4:34 PM-Discussed treatment plan which includes sling, Decadron, Diclofenac and orthopedic referral if not improving with pt at bedside and pt agreed to plan.   Labs Review Labs Reviewed - No data to display  Imaging Review No results found. I have personally reviewed and evaluated these images and lab results as  part of my medical decision-making.   EKG Interpretation None      MDM  Vital signs stable. Exam favors tendonitis. No hot joints. No evidence for trauma or dislocation. Pt fitted with sling. Rx for decadron and voltaren given to the patient. Pt to follow up with Dr Dion SaucierLandau if not improving.   Final diagnoses:  None    **I have reviewed nursing notes, vital signs, and all appropriate lab and  imaging results for this patient.*  **I personally performed the services described in this documentation, which was scribed in my presence. The recorded information has been reviewed and is accurate.Ivery Quale*   Mercades Bajaj, PA-C 12/25/15 1652  Courteney Randall AnLyn Mackuen, MD 12/25/15 2337

## 2016-01-02 ENCOUNTER — Emergency Department (HOSPITAL_COMMUNITY)
Admission: EM | Admit: 2016-01-02 | Discharge: 2016-01-02 | Discharge status: Absent without leave | Payer: PRIVATE HEALTH INSURANCE

## 2016-01-02 NOTE — ED Notes (Signed)
Wrong pt arrived by mistake

## 2017-12-20 ENCOUNTER — Emergency Department (HOSPITAL_COMMUNITY)
Admission: EM | Admit: 2017-12-20 | Discharge: 2017-12-21 | Disposition: A | Discharge status: Home / Self Care | Payer: PRIVATE HEALTH INSURANCE | Attending: Emergency Medicine | Admitting: Emergency Medicine

## 2017-12-20 ENCOUNTER — Encounter (HOSPITAL_COMMUNITY): Payer: Self-pay | Admitting: *Deleted

## 2017-12-20 ENCOUNTER — Other Ambulatory Visit: Payer: Self-pay

## 2017-12-20 DIAGNOSIS — F17200 Nicotine dependence, unspecified, uncomplicated: Secondary | ICD-10-CM | POA: Insufficient documentation

## 2017-12-20 DIAGNOSIS — Z79899 Other long term (current) drug therapy: Secondary | ICD-10-CM | POA: Insufficient documentation

## 2017-12-20 DIAGNOSIS — J029 Acute pharyngitis, unspecified: Secondary | ICD-10-CM | POA: Insufficient documentation

## 2017-12-20 DIAGNOSIS — R0982 Postnasal drip: Secondary | ICD-10-CM | POA: Insufficient documentation

## 2017-12-20 NOTE — ED Triage Notes (Signed)
The pt reports that he has had throat irritation since Wednesday  He admits to smoking pot frequently and he has been smoking it today also

## 2017-12-21 LAB — RAPID STREP SCREEN (MED CTR MEBANE ONLY): STREPTOCOCCUS, GROUP A SCREEN (DIRECT): NEGATIVE

## 2017-12-21 MED ORDER — LIDOCAINE VISCOUS 2 % MT SOLN: 15.0000 mL | OROMUCOSAL | 0 refills | Status: DC | PRN

## 2017-12-21 MED ORDER — NAPROXEN 500 MG PO TABS: 500.0000 mg | ORAL_TABLET | Freq: Two times a day (BID) | ORAL | 0 refills | Status: DC

## 2017-12-21 NOTE — Discharge Instructions (Signed)
Please read attached information regarding her condition. Use lidocaine solution to swish and spit as needed for throat irritation.  Do not swallow. Take naproxen as needed for pain. Return to ED for worsening symptoms, trouble breathing, trouble swallowing, severe neck pain, coughing up blood.

## 2017-12-21 NOTE — ED Notes (Signed)
See EDP note for further assessment and evaluation. 

## 2017-12-21 NOTE — ED Provider Notes (Signed)
[facility] Provider Note   CSN: [id number] Arrival date & time: 12/20/17  2337     History   Chief Complaint Chief Complaint  Patient presents with  . throat irrritation    HPI Charles Schultz is a 36 y.o. male who presents to ED for evaluation of 5-day history of throat irritation and feeling of a "scratchy throat.".  States that he is also having some mucus produced that is caused by the irritation.  Reports history of similar symptom to strep throat.  He denies any fever, cough, chest pain, neck pain, trouble breathing, trouble swallowing, drooling, swelling of neck.  He has not tried any over-the-counter medications to help with symptoms.  HPI  History reviewed. No pertinent past medical history.  There are no active problems to display for this patient.   History reviewed. No pertinent surgical history.     Home Medications    Prior to Admission medications   Medication Sig Start Date End Date Taking? Authorizing Provider  dexamethasone (DECADRON) 6 MG tablet 1 po bid with food 12/25/15   Ivery QualeBryant, Hobson, PA-C  diclofenac (VOLTAREN) 75 MG EC tablet Take 1 tablet (75 mg total) by mouth 2 (two) times daily. 12/25/15   Ivery QualeBryant, Hobson, PA-C  lidocaine (XYLOCAINE) 2 % solution Use as directed 15 mLs in the mouth or throat as needed for mouth pain. 12/21/17   Jariel Drost, PA-C  meloxicam (MOBIC) 15 MG tablet Take 1 tablet (15 mg total) by mouth daily. 10/22/15   Szekalski, Kaitlyn, PA-C  naproxen (NAPROSYN) 500 MG tablet Take 1 tablet (500 mg total) by mouth 2 (two) times daily. 12/21/17   Dietrich PatesKhatri, Syncere Kaminski, PA-C    Family History No family history on file.  Social History Social History   Tobacco Use  . Smoking status: Current Every Day Smoker  . Smokeless tobacco: Never Used  Substance Use Topics  . Alcohol use: Yes  . Drug use: No     Allergies   Patient has no known allergies.   Review of Systems Review of Systems    Constitutional: Negative for chills and fever.  HENT: Positive for sore throat. Negative for congestion, dental problem, drooling, hearing loss, mouth sores, rhinorrhea, sinus pain, trouble swallowing and voice change.   Respiratory: Negative for cough and shortness of breath.   Musculoskeletal: Negative for neck pain and neck stiffness.     Physical Exam Updated Vital Signs BP 127/78 (BP Location: Right Arm)   Pulse 61   Temp (!) 97.5 F (36.4 C) (Oral)   Resp 18   Ht 5\' 7"  (1.702 m)   Wt 73.9 kg (163 lb)   SpO2 100%   BMI 25.53 kg/m   Physical Exam  Constitutional: He appears well-developed and well-nourished. No distress.  Nontoxic appearing and in no acute distress.  Speaking complete sentences without difficulty.  HENT:  Head: Normocephalic and atraumatic.  Right Ear: Tympanic membrane normal.  Left Ear: Tympanic membrane normal.  Nose: Nose normal.  Mouth/Throat: Uvula is midline. No uvula swelling. Posterior oropharyngeal erythema present. No posterior oropharyngeal edema. No tonsillar exudate.  Patient does not appear to be in acute distress. No trismus or drooling present. No pooling of secretions. Patient is tolerating secretions and is not in respiratory distress. No neck pain or tenderness to palpation of the neck. Full active and passive range of motion of the neck. No evidence of RPA or PTA.  Eyes: Conjunctivae and EOM are normal. No scleral icterus.  Neck: Normal range of motion.  Pulmonary/Chest: Effort normal. No respiratory distress.  Lymphadenopathy:    He has no cervical adenopathy.  Neurological: He is alert.  Skin: No rash noted. He is not diaphoretic.  Psychiatric: He has a normal mood and affect.  Nursing note and vitals reviewed.    ED Treatments / Results  Labs (all labs ordered are listed, but only abnormal results are displayed) Labs Reviewed  RAPID STREP [facility]CREEN (NOT [facility])  CULTURE, GROUP A STREP [facility])    EKG  EKG  Interpretation None       Radiology No results found.  Procedures Procedures (including critical care time)  Medications Ordered in ED Medications - No data to display   Initial Impression / Assessment and Plan / ED Course  I have reviewed the triage vital signs and the nursing notes.  Pertinent labs & imaging results that were available during my care of the patient were reviewed by me and considered in my medical decision making (see chart for details).     Patient presents to ED for evaluation of 5-day history of throat irritation as well as postnasal drainage.  Reports history of similar symptoms in the past due to strep throat.  On physical exam patient is overall well-appearing.  Patient is tolerating secretions, not in respiratory distress, no neck pain, no trismus. Presentation not concerning for peritonsillar abscess or spread of infection to deep spaces of the throat; patent airway.  Rapid strep is negative.  Pt will be discharged with lidocaine to swish and spit for discomfort as well as naproxen to help with pain. Specific return precautions discussed. Recommended PCP follow up. Pt appears safe for discharge.    Final Clinical Impressions(s) / ED Diagnoses   Final diagnoses:  Viral pharyngitis    ED Discharge Orders        Ordered    lidocaine (XYLOCAINE) 2 % solution  As needed     12/21/17 0050    naproxen (NAPROSYN) 500 MG tablet  2 times daily     12/21/17 0050     Portions of this note were generated with Dragon dictation software. Dictation errors may occur despite best attempts at proofreading.    Dietrich PatesKhatri, Dillard Pascal, PA-C 12/21/17 [id number]    Ward, Layla MawKristen N, DO 12/21/17 0120

## 2017-12-23 LAB — CULTURE, GROUP A STREP (THRC)

## 2017-12-30 ENCOUNTER — Emergency Department (HOSPITAL_COMMUNITY)
Admission: EM | Admit: 2017-12-30 | Discharge: 2017-12-31 | Disposition: A | Discharge status: Home / Self Care | Payer: BLUE CROSS/BLUE SHIELD | Attending: Emergency Medicine | Admitting: Emergency Medicine

## 2017-12-30 ENCOUNTER — Encounter (HOSPITAL_COMMUNITY): Payer: Self-pay | Admitting: Emergency Medicine

## 2017-12-30 DIAGNOSIS — B9789 Other viral agents as the cause of diseases classified elsewhere: Secondary | ICD-10-CM | POA: Diagnosis not present

## 2017-12-30 DIAGNOSIS — J029 Acute pharyngitis, unspecified: Secondary | ICD-10-CM

## 2017-12-30 DIAGNOSIS — Z87891 Personal history of nicotine dependence: Secondary | ICD-10-CM | POA: Insufficient documentation

## 2017-12-30 NOTE — ED Notes (Signed)
Patient is A&Ox4.  No signs of distress noted.  Please see providers complete history and physical exam.  

## 2017-12-30 NOTE — ED Triage Notes (Signed)
Patient arrives with complaint of continued discomfort in throat. States onset 10 days ago. Was seen about 7 days ago and advised on home care. Discomfort has not resolved so patient is presenting again for evaluation. VSS, afebrile.

## 2017-12-31 LAB — MONONUCLEOSIS SCREEN: MONO SCREEN: NEGATIVE

## 2017-12-31 LAB — RAPID STREP SCREEN (MED CTR MEBANE ONLY): STREPTOCOCCUS, GROUP A SCREEN (DIRECT): NEGATIVE

## 2017-12-31 MED ORDER — METHOCARBAMOL 500 MG PO TABS: 500.0000 mg | ORAL_TABLET | Freq: Three times a day (TID) | ORAL | 0 refills | Status: DC | PRN

## 2017-12-31 MED ORDER — DEXAMETHASONE SODIUM PHOSPHATE 10 MG/ML IJ SOLN
10.0000 mg | Freq: Once | INTRAMUSCULAR | Status: AC
Start: 2017-12-31 — End: 2017-12-31
  Administered 2017-12-31: 10 mg via INTRAMUSCULAR
  Filled 2017-12-31: qty 1

## 2017-12-31 MED ORDER — NAPROXEN 500 MG PO TABS: 500.0000 mg | ORAL_TABLET | Freq: Two times a day (BID) | ORAL | 0 refills | Status: DC

## 2017-12-31 NOTE — Discharge Instructions (Addendum)
You were seen in the emergency department and diagnosed with viral pharyngitis. Your rapid strep test was negative- if your culture is positive we will call you and let you know. Your rapid mono test was negative.   You were given a steroid injection in the emergency department to help with pain and swelling.   Follow up with one of the primary care providers in your discharge instructions within 1 week for re-evaluation and to establish care. Call tomorrow to try to make an appointment for next week. Return to the emergency department for any new or worsening symptoms including but not limited to inability to open your mouth, move your neck, or swallow, change in your voice, fever not improved by tylenol/ibuprofen, difficulty breathing, or any other concerns.

## 2017-12-31 NOTE — ED Provider Notes (Signed)
[facility] EMERGENCY DEPARTMENT Provider Note   CSN: [id number] Arrival date & time: 12/30/17  1920     History   Chief Complaint Chief Complaint  Patient presents with  . Sore Throat    HPI Charles Schultz is a 37 y.o. male with a hx of tobacco use returns to the ED for continued sore throat with swallowing that has been occurring since 12/20. Patient was seen in the ED 12/23 for the sore throat- had negative strep. Sent home with prescription for Naproxen, taking this as prescribed without relief. States he also received a lidocaine mouth wash, has not used this because he is not having continuous pain, now only having pain when swallowing. States also feels like his neck glands are swollen and uncomfortable, this was occurring at previous visit, just seems somewhat worse. No other alleviating aggravating factors. Patient is able to swallow. Having generalized fatigue and mild dry cough. Denies fever, chills, neck stiffness, congestion, rhinorrhea, ear pain, chest pain, or dyspnea.   HPI  History reviewed. No pertinent past medical history.  There are no active problems to display for this patient.   History reviewed. No pertinent surgical history.     Home Medications    Prior to Admission medications   Medication Sig Start Date End Date Taking? Authorizing Provider  dexamethasone (DECADRON) 6 MG tablet 1 po bid with food Patient not taking: Reported on 12/30/2017 12/25/15   Ivery Quale, PA-C  diclofenac (VOLTAREN) 75 MG EC tablet Take 1 tablet (75 mg total) by mouth 2 (two) times daily. Patient not taking: Reported on 12/30/2017 12/25/15   Ivery Quale, PA-C  lidocaine (XYLOCAINE) 2 % solution Use as directed 15 mLs in the mouth or throat as needed for mouth pain. Patient not taking: Reported on 12/30/2017 12/21/17   Dietrich Pates, PA-C  meloxicam (MOBIC) 15 MG tablet Take 1 tablet (15 mg total) by mouth daily. Patient not taking: Reported on 12/30/2017  10/22/15   Emilia Beck, PA-C  naproxen (NAPROSYN) 500 MG tablet Take 1 tablet (500 mg total) by mouth 2 (two) times daily. Patient not taking: Reported on 12/30/2017 12/21/17   Dietrich Pates, PA-C    Family History History reviewed. No pertinent family history.  Social History Social History   Tobacco Use  . Smoking status: Current Every Day Smoker  . Smokeless tobacco: Never Used  Substance Use Topics  . Alcohol use: Yes  . Drug use: No     Allergies   Patient has no known allergies.   Review of Systems Review of Systems  Constitutional: Positive for fatigue. Negative for chills and fever.  HENT: Positive for sore throat (with swallowing). Negative for congestion, dental problem, ear pain, rhinorrhea and voice change.   Eyes: Negative for pain, discharge and itching.  Respiratory: Positive for cough ( mild, dry, intermittent). Negative for shortness of breath.   Cardiovascular: Negative for chest pain.  Gastrointestinal: Negative for abdominal pain.  Musculoskeletal: Negative for neck stiffness.       Feels like neck glands are swollen.    Physical Exam Updated Vital Signs BP 136/81 (BP Location: Right Arm)   Pulse (!) 52   Temp (!) 97.3 F (36.3 C) (Oral)   Resp 16   SpO2 100%   Physical Exam  Constitutional: He appears well-developed and well-nourished.  Non-toxic appearance. No distress.  HENT:  Head: Normocephalic and atraumatic.  Right Ear: Tympanic membrane is not perforated, not erythematous, not retracted and not bulging. A middle  ear effusion (mild) is present.  Left Ear: Tympanic membrane is not perforated, not erythematous, not retracted and not bulging. A middle ear effusion (mild) is present.  Nose: Mucosal edema present.  Mouth/Throat: Uvula is midline. No trismus in the jaw. No uvula swelling. Posterior oropharyngeal erythema present. No oropharyngeal exudate or posterior oropharyngeal edema.  No hot potato voice. Patient tolerating his own  secretions without difficulty.  Eyes: Conjunctivae are normal. Right eye exhibits no discharge. Left eye exhibits no discharge.  Neck: Normal range of motion. Neck supple.  Submandibular compartment is soft.   Cardiovascular: Normal rate and regular rhythm.  No murmur heard. Pulmonary/Chest: Breath sounds normal. No respiratory distress. He has no wheezes. He has no rales.  Abdominal: Soft. He exhibits no distension. There is no tenderness.  Lymphadenopathy:    He has cervical adenopathy (mild anterior, bilateral. compartment is soft. ).  Neurological: He is alert.  Clear speech.   Skin: Skin is warm and dry. No rash noted.  Psychiatric: He has a normal mood and affect. His behavior is normal.  Nursing note and vitals reviewed.  ED Treatments / Results  Labs (all labs ordered are listed, but only abnormal results are displayed) Labs Reviewed  RAPID STREP SCREEN (NOT AT [facility])  CULTURE, GROUP A STREP [facility])  MONONUCLEOSIS SCREEN   EKG  EKG Interpretation None      Radiology No results found.  Procedures Procedures (including critical care time)  Medications Ordered in ED Medications  dexamethasone (DECADRON) injection 10 mg (not administered)     Initial Impression / Assessment and Plan / ED Course  I have reviewed the triage vital signs and the nursing notes.  Pertinent labs & imaging results that were available during my care of the patient were reviewed by me and considered in my medical decision making (see chart for details).    Patient presents with signs/sxs consistent with viral pharyngitis. Patient is nontoxic appearing and in no apparent distress. Rapid strep negative- culture pending. Given persistence of sxs evaluated with mono screen- this was negative. Exam non-concerning for PTA/RPA- no trismus, tolerating secretions without difficulty, no hot potato voice, no uvula deviation, full painless ROM of neck, neck compartments are soft. Will treat with dose of  decadron in the ED with PCP follow up. Provided local PCP offices as patient has not established care with a primary care office in the area.Community clinic information also provided. I discussed results, treatment plan, need for PCP follow-up, and return precautions with the patient. Provided opportunity for questions, patient confirmed understanding and is in agreement with plan.   Final Clinical Impressions(s) / ED Diagnoses   Final diagnoses:  Viral pharyngitis    ED Discharge Orders    None       Cherly Andersonetrucelli, Demi Trieu R, PA-C 12/31/17 0304    Dione BoozeGlick, David, MD 12/31/17 [phone]

## 2018-01-02 LAB — CULTURE, GROUP A STREP (THRC)

## 2018-01-12 NOTE — Progress Notes (Signed)
Patient ID: Charles Schultz, male   DOB: 04-24-1981, 37 y.o.   MRN: [id number]     Charles GaulLarry Dority, is a 37 y.o. male  WGN:[id number]  HQI:[id number]  DOB - 04-24-1981  Subjective:  Chief Complaint and HPI: Charles Schultz is a 37 y.o. male here today to establish care and for a follow up visit After being seen in the ED 12/30/2017 for sore throat.  She had also been seen for this 12/20/2017.  Mono and strep were negative.  She was treated with Decadron and symptomatic care.  He presents today with ST that is improved.  However, he has noticed there is a residual lump in his L neck/submandibular area.  No weight loss.  No change in appetite.  No dysphagia.  He feels like there may be times that he presses on it and some greenish discharge will well up in the floor of his mouth.  No night sweats.    ED/Hospital notes reviewed.   Social History:  Married, works 3rd shift  ROS:   Constitutional:  No f/c, No night sweats, No unexplained weight loss. EENT:  No vision changes, No blurry vision, No hearing changes.  Respiratory: No cough, No SOB Cardiac: No CP, no palpitations GI:  No abd pain, No N/V/D. GU: No Urinary s/sx Musculoskeletal: No joint pain Neuro: No headache, no dizziness, no motor weakness.  Skin: No rash Endocrine:  No polydipsia. No polyuria.  Psych: Denies SI/HI  No problems updated.  ALLERGIES: No Known Allergies  PAST MEDICAL HISTORY: No past medical history on file.  MEDICATIONS AT HOME: Prior to Admission medications   Not on File     Objective:  EXAM:   Vitals:   01/13/18 0910  BP: 122/71  Pulse: 70  Resp: 16  Temp: (!) 97.4 F (36.3 C)  TempSrc: Oral  SpO2: 96%  Weight: 129 lb 3.2 oz (58.6 kg)    General appearance : A&OX3. NAD. Non-toxic-appearing HEENT: Atraumatic and Normocephalic.  PERRLA. EOM intact.  TM clear B. Mouth-MMM, post pharynx WNL w/o erythema, No PND. Floor of the mouth on L tender with no obvious lesion.   Neck: supple, no JVD.  No cervical lymphadenopathy. No thyromegaly.  There is an area in the L submandibular area that is enlarged and slightly firm bit mobile.   Chest/Lungs:  Breathing-non-labored, Good air entry bilaterally, breath sounds normal without rales, rhonchi, or wheezing  CVS: S1 S2 regular, no murmurs, gallops, rubs  Extremities: Bilateral Lower Ext shows no edema, both legs are warm to touch with = pulse throughout Neurology:  CN II-XII grossly intact, Non focal.   Psych:  TP linear. J/I WNL. Normal speech. Appropriate eye contact and affect.  Skin:  No Rash  Data Review No results found for: HGBA1C   Assessment & Plan   1. Lymphadenopathy, submandibular - CBC with Differential/Platelet - TSH - Basic metabolic panel - US Soft Tissue Head/Neck; Future  2. Lymphadenopathy - CBC with Differential/Platelet - TSH - Basic metabolic panel - US Soft Tissue Head/Neck; Future  3. Smoker Smoking and dangers of nicotine have been discussed at length. Long term health consequences of smoking reviewed in detail.  Methods for helping with cessation have been reviewed.  Patient expresses understanding.   Patient have been counseled extensively about nutrition and exercise  Return in about 6 weeks (around 02/24/2018) for assign PCP; f/up Lymphadenopathy.  The patient was given clear instructions to go to ER or return to medical center if symptoms don't improve, worsen  or new problems develop. The patient verbalized understanding. The patient was told to call to get lab results if they haven't heard anything in the next week.     Georgian Co, PA-C [facility] and [facility] [place], [place] [phone]   01/13/2018, 9:23 AM

## 2018-01-13 ENCOUNTER — Ambulatory Visit: Payer: BLUE CROSS/BLUE SHIELD | Attending: Internal Medicine | Admitting: Physician Assistant

## 2018-01-13 VITALS — BP 122/71 | HR 70 | Temp 97.4°F | Resp 16 | Wt 129.2 lb

## 2018-01-13 DIAGNOSIS — R590 Localized enlarged lymph nodes: Secondary | ICD-10-CM

## 2018-01-13 DIAGNOSIS — F17200 Nicotine dependence, unspecified, uncomplicated: Secondary | ICD-10-CM

## 2018-01-13 DIAGNOSIS — R591 Generalized enlarged lymph nodes: Secondary | ICD-10-CM | POA: Insufficient documentation

## 2018-01-13 NOTE — Patient Instructions (Signed)

## 2018-01-14 ENCOUNTER — Telehealth: Payer: Self-pay

## 2018-01-14 LAB — CBC WITH DIFFERENTIAL/PLATELET
BASOS ABS: 0.1 10*3/uL (ref 0.0–0.2)
BASOS: 1 %
EOS (ABSOLUTE): 0.3 10*3/uL (ref 0.0–0.4)
Eos: 5 %
Hematocrit: 36.9 % — ABNORMAL LOW (ref 37.5–51.0)
Hemoglobin: 11.9 g/dL — ABNORMAL LOW (ref 13.0–17.7)
IMMATURE GRANS (ABS): 0 10*3/uL (ref 0.0–0.1)
Immature Granulocytes: 0 %
Lymphocytes Absolute: 2.5 10*3/uL (ref 0.7–3.1)
Lymphs: 45 %
MCH: 30.9 pg (ref 26.6–33.0)
MCHC: 32.2 g/dL (ref 31.5–35.7)
MCV: 96 fL (ref 79–97)
Monocytes Absolute: 0.5 10*3/uL (ref 0.1–0.9)
Monocytes: 10 %
NEUTROS PCT: 39 %
Neutrophils Absolute: 2.2 10*3/uL (ref 1.4–7.0)
PLATELETS: 276 10*3/uL (ref 150–379)
RBC: 3.85 x10E6/uL — AB (ref 4.14–5.80)
RDW: 13.8 % (ref 12.3–15.4)
WBC: 5.6 10*3/uL (ref 3.4–10.8)

## 2018-01-14 LAB — BASIC METABOLIC PANEL
BUN/Creatinine Ratio: 9 (ref 9–20)
BUN: 10 mg/dL (ref 6–20)
CALCIUM: 8.8 mg/dL (ref 8.7–10.2)
CHLORIDE: 106 mmol/L (ref 96–106)
CO2: 26 mmol/L (ref 20–29)
Creatinine, Ser: 1.11 mg/dL (ref 0.76–1.27)
GFR calc Af Amer: 98 mL/min/{1.73_m2} (ref >59)
GFR calc non Af Amer: 84 mL/min/{1.73_m2} (ref >59)
GLUCOSE: 88 mg/dL (ref 65–99)
POTASSIUM: 4 mmol/L (ref 3.5–5.2)
Sodium: 144 mmol/L (ref 134–144)

## 2018-01-14 LAB — TSH: TSH: 0.691 u[IU]/mL (ref 0.450–4.500)

## 2018-01-14 NOTE — Telephone Encounter (Signed)
Contacted pt to go over lab results pt didn't answer lvm asking pt to give me a call at his earliest convienence   If pt calls back please give results: His blood count, thyroid hormone, kidney function, electrolytes, and blood sugar are normal. This is reassuring. I will wait to make any referrals until after he has his ultrasound done.

## 2018-01-15 ENCOUNTER — Telehealth: Payer: Self-pay | Admitting: General Practice

## 2018-01-19 ENCOUNTER — Ambulatory Visit (HOSPITAL_COMMUNITY): Payer: BLUE CROSS/BLUE SHIELD

## 2018-01-22 ENCOUNTER — Ambulatory Visit (HOSPITAL_COMMUNITY)
Admission: RE | Admit: 2018-01-22 | Discharge: 2018-01-22 | Disposition: A | Discharge status: Home / Self Care | Payer: BLUE CROSS/BLUE SHIELD | Source: Ambulatory Visit | Attending: Physician Assistant | Admitting: Physician Assistant

## 2018-01-22 DIAGNOSIS — R590 Localized enlarged lymph nodes: Secondary | ICD-10-CM

## 2018-01-22 DIAGNOSIS — R591 Generalized enlarged lymph nodes: Secondary | ICD-10-CM | POA: Diagnosis not present

## 2018-01-27 ENCOUNTER — Other Ambulatory Visit: Payer: Self-pay | Admitting: Physician Assistant

## 2018-01-27 ENCOUNTER — Telehealth: Payer: Self-pay

## 2018-01-27 MED ORDER — IBUPROFEN 600 MG PO TABS: 600.0000 mg | ORAL_TABLET | Freq: Three times a day (TID) | ORAL | 0 refills | Status: DC

## 2018-01-27 MED ORDER — PENICILLIN V POTASSIUM 500 MG PO TABS: 500.0000 mg | ORAL_TABLET | Freq: Three times a day (TID) | ORAL | 0 refills | Status: DC

## 2018-01-27 NOTE — Telephone Encounter (Signed)
CMA called patient to inform patient regarding lab result and PCP advising.   Patient aware Rx been sent to pharmacy.

## 2018-01-27 NOTE — Telephone Encounter (Signed)
-----   Message from Anders SimmondsAngela M McClung, [place]-C sent at 01/27/2018  2:11 PM EST ----- Please call patient.  His ultrasound showed and infection/inflammation of the salivary glands.  I have sent him an antibiotic and anti-inflammatory to take 3 times daily for 10 days.  He can also get very sour candy to suck on as this helps produce more saliva and activate the glands that are affected.  He should schedule a follow up appointment with us in about 3 to 6 weeks.   Thanks, Georgian CoAngela McClung, PA-C

## 2018-02-24 ENCOUNTER — Encounter: Payer: Self-pay | Admitting: Nurse Practitioner

## 2018-02-24 ENCOUNTER — Ambulatory Visit: Payer: BLUE CROSS/BLUE SHIELD | Attending: Nurse Practitioner | Admitting: Nurse Practitioner

## 2018-02-24 VITALS — BP 112/69 | HR 59 | Temp 97.4°F | Ht 67.0 in | Wt 133.8 lb

## 2018-02-24 DIAGNOSIS — Z0000 Encounter for general adult medical examination without abnormal findings: Secondary | ICD-10-CM | POA: Diagnosis not present

## 2018-02-24 DIAGNOSIS — R799 Abnormal finding of blood chemistry, unspecified: Secondary | ICD-10-CM | POA: Insufficient documentation

## 2018-02-24 DIAGNOSIS — K1120 Sialoadenitis, unspecified: Secondary | ICD-10-CM | POA: Insufficient documentation

## 2018-02-24 DIAGNOSIS — R7989 Other specified abnormal findings of blood chemistry: Secondary | ICD-10-CM

## 2018-02-24 DIAGNOSIS — Z833 Family history of diabetes mellitus: Secondary | ICD-10-CM | POA: Diagnosis not present

## 2018-02-24 MED ORDER — AMOXICILLIN-POT CLAVULANATE 875-125 MG PO TABS: 1.0000 | ORAL_TABLET | Freq: Two times a day (BID) | ORAL | 0 refills | Status: DC

## 2018-02-24 MED ORDER — TETANUS-DIPHTH-ACELL PERTUSSIS 5-2.5-18.5 LF-MCG/0.5 IM SUSP: 0.5000 mL | Freq: Once | INTRAMUSCULAR | 0 refills | Status: AC

## 2018-02-24 MED FILL — BOOSTRIX VACCINE SYRINGE: 5-2.5-18.5 | 1 days supply | Qty: 1 | Fill #0

## 2018-02-24 NOTE — Progress Notes (Signed)
Assessment & Plan:  Charles Schultz was seen today for establish care.  Diagnoses and all orders for this visit:  Sialoadenitis -     amoxicillin-clavulanate (AUGMENTIN) 875-125 MG tablet; Take 1 tablet by mouth 2 (two) times daily. -     Ambulatory referral to ENT Apply heat to the affected area  Abnormal CBC measurement -     CBC -     Iron, TIBC and Ferritin Panel  Routine adult health maintenance -     HIV antibody (with reflex) -     Tdap (BOOSTRIX) 5-2.5-18.5 LF-MCG/0.5 injection; Inject 0.5 mLs into the muscle once for 1 dose.    Patient has been counseled on age-appropriate routine health concerns for screening and prevention. These are reviewed and up-to-date. Referrals have been placed accordingly. Immunizations are up-to-date or declined.    Subjective:   Chief Complaint  Patient presents with  . Establish Care    Patient is here to establish care for lymphadenopathy. The swelling of his throat is still there but much better, no pain.    HPI Charles Schultz 37 y.o. male presents to office today to establish care and for follow up of sialoadenitis. He was prescribed PCN V at his last office appointment for his symptoms. He endorses completion and full compliance with taking. However there is still noticeable left submandibular swelling. He continues to take ibuprofen and endorses decreased tenderness. He denies using any heat application to the area. Endorses increased mucous production under his tongue when he applies firm pressure to the affected area. I will prescribe augmentin today and refer to ENT.   Review of Systems  Constitutional: Negative.  Negative for chills, fever, malaise/fatigue and weight loss.  HENT: Negative for ear pain, hearing loss, sinus pain, sore throat and tinnitus.        SEE HPI  Eyes: Negative for blurred vision.  Respiratory: Negative.  Negative for cough, sputum production and shortness of breath.   Cardiovascular: Negative.  Negative for chest  pain and orthopnea.  Gastrointestinal: Negative for nausea and vomiting.  Neurological: Negative.  Negative for dizziness, tingling, speech change, focal weakness and headaches.    History reviewed. No pertinent past medical history.  History reviewed. No pertinent surgical history.  Family History  Problem Relation Age of Onset  . Diabetes Father     Social History Reviewed with no changes to be made today.   Outpatient Medications Prior to Visit  Medication Sig Dispense Refill  . ibuprofen (ADVIL,MOTRIN) 600 MG tablet Take 1 tablet (600 mg total) by mouth 3 (three) times daily. X 10 days then prn pain (Patient not taking: Reported on 02/24/2018) 60 tablet 0  . penicillin v potassium (VEETID) 500 MG tablet Take 1 tablet (500 mg total) by mouth 3 (three) times daily. (Patient not taking: Reported on 02/24/2018) 30 tablet 0   No facility-administered medications prior to visit.     No Known Allergies     Objective:    BP 112/69 (BP Location: Left Arm, Patient Position: Sitting, Cuff Size: Normal)   Pulse (!) 59   Temp (!) 97.4 F (36.3 C) (Oral)   Ht 5\' 7"  (1.702 m)   Wt 133 lb 12.8 oz (60.7 kg)   SpO2 99%   BMI 20.96 kg/m  Wt Readings from Last 3 Encounters:  02/24/18 133 lb 12.8 oz (60.7 kg)  01/13/18 129 lb 3.2 oz (58.6 kg)  12/20/17 163 lb (73.9 kg)    Physical Exam  Constitutional: He is  oriented to person, place, and time. He appears well-developed and well-nourished. He is cooperative.  HENT:  Head: Normocephalic and atraumatic.    Eyes: EOM are normal.  Neck: Normal range of motion. No JVD present. No tracheal deviation present. No thyromegaly present.  Cardiovascular: Normal rate, regular rhythm and normal heart sounds. Exam reveals no gallop and no friction rub.  No murmur heard. Pulmonary/Chest: Effort normal and breath sounds normal. No tachypnea. No respiratory distress. He has no decreased breath sounds. He has no rhonchi.  Abdominal: Soft. Bowel  sounds are normal.  Musculoskeletal: Normal range of motion.  Lymphadenopathy:       Head (left side): Submandibular adenopathy present.    He has cervical adenopathy.  Neurological: He is alert and oriented to person, place, and time. Coordination normal.  Skin: Skin is warm and dry.  Psychiatric: He has a normal mood and affect. His behavior is normal. Judgment and thought content normal.  Nursing note and vitals reviewed.      Patient has been counseled extensively about nutrition and exercise as well as the importance of adherence with medications and regular follow-up. The patient was given clear instructions to go to ER or return to medical center if symptoms don't improve, worsen or new problems develop. The patient verbalized understanding.   Follow-up: Return for Physical ONLY no labs.   Claiborne Rigg, FNP-BC [facility] and [facility] [place], [place] [phone]   02/24/2018, 10:34 AM

## 2018-02-24 NOTE — Patient Instructions (Addendum)
Salivary Gland Infection  A salivary gland infection is an infection in one or more of the glands that produce spit (saliva). You have six major salivary glands. Each gland has a duct that carries saliva into your mouth. Saliva keeps your mouth moist and breaks down the food that you eat. It also helps to prevent tooth decay.  Two salivary glands are located just in front of your ears (parotid). The ducts for these glands open up inside your cheeks, near your back teeth. You also have two glands under your tongue (sublingual) and two glands under your jaw (submandibular). The ducts for these glands open under your tongue. Any salivary gland can become infected. Most infections occur in the parotid glands or submandibular glands.  What are the causes?  Salivary glands can be infected by viruses or bacteria.  · The mumps virus is the most common cause of viral salivary gland infections, though mumps is now rare in many areas because of vaccination.  ? This infection causes swelling in both parotid glands.  ? Viral infections are more common in children.  · The bacteria that cause salivary gland infections are usually the same bacteria that normally live in your mouth.  ? A stone can form in a salivary gland and block the flow of saliva. As a result, saliva backs up into the salivary gland. Bacteria may then start to grow behind the blockage and cause infection.  ? Bacterial infections usually cause pain and swelling on one side of the face. Submandibular gland swelling occurs under the jaw. Parotid swelling occurs in front of the ear.  ? Bacterial infections are more common in adults.    What increases the risk?  Children who do not get the MMR (measles, mumps, rubella) vaccine are more likely to get mumps, which can cause a viral salivary gland infection.  Risk factors for bacterial infections include:  · Poor dental care (oral hygiene).  · Smoking.  · Not drinking enough water.  · Having a disease that causes dry  mouth and dry eyes (Mikulicz syndrome or Sjogren syndrome).    What are the signs or symptoms?  The main sign of salivary gland infection is a swollen salivary gland. This type of inflammation is often called sialadenitis. You may have swelling in front of your ear, under your jaw, or under your tongue. Swelling may get worse when you eat and decrease after you eat. Other signs and symptoms include:  · Pain.  · Tenderness.  · Redness.  · Dry mouth.  · Bad taste in your mouth.  · Difficulty chewing and swallowing.  · Fever.    How is this diagnosed?  Your health care provider may suspect a salivary gland infection based on your signs and symptoms. He or she will also do a physical exam. The health care provider will look and feel inside your mouth to see whether a stone is blocking a salivary gland duct. You may need to see an ear, nose, and throat specialist (ENT or otolaryngologist) for diagnosis and treatment. You may also need to have diagnostic tests, such as:  · An X-ray to check for a stone.  · Other imaging studies to look for an abscess and to rule out other causes of swelling. These tests may include:  ? Ultrasound.  ? CT scan.  ? MRI.  · Culture and sensitivity test. This involves collecting a sample of pus for testing in the lab to see what bacteria grow and what antibiotics   without treatment. Bacterial infections are usually treated with antibiotic medicine. Severe infections that cause difficulty with swallowing may be treated with an IV antibiotic in the hospital. Other treatments may include:  Probing and widening the salivary duct to allow a stone to pass. In some cases, a thin, flexible scope (endoscope) may be inserted into the duct to find a stone and remove it.  Breaking  up a stone using sound waves.  Draining an infected gland (abscess) with a needle.  In some cases, you may need surgery so your health care provider can: ? Remove a stone. ? Drain pus from an abscess. ? Remove a badly infected gland.  Follow these instructions at home:  Take medicines only as directed by your health care provider.  If you were prescribed an antibiotic medicine, finish it all even if you start to feel better.  Follow these instructions every few hours: ? Suck on a lemon candy to stimulate the flow of saliva. ? Put a warm compress over the gland. ? Gently massage the gland.  Drink enough fluid to keep your urine clear or pale yellow.  Rinse your mouth with a mixture of warm water and salt every few hours. To make this mixture, add a pinch of salt to 1 cup of warm water.  Practice good oral hygiene by brushing and flossing your teeth after meals and before you go to bed.  Do not use any tobacco products, including cigarettes, chewing tobacco, or electronic cigarettes. If you need help quitting, ask your health care provider. Contact a health care provider if:  You have pain and swelling in your face, jaw, or mouth after eating.  You have persistent swelling in any of these places: ? In front of your ear. ? Under your jaw. ? Inside your mouth. Get help right away if:  You have pain and swelling in your face, jaw, or mouth that are getting worse.  Your pain and swelling make it hard to swallow or breathe. This information is not intended to replace advice given to you by your health care provider. Make sure you discuss any questions you have with your health care provider. Document Released: 01/22/2005 Document Revised: 05/22/2016 Document Reviewed: 05/17/2014 Elsevier Interactive Patient Education  2018 [employer].

## 2018-02-25 LAB — IRON,TIBC AND FERRITIN PANEL
Ferritin: 94 ng/mL (ref 30–400)
IRON: 61 ug/dL (ref 38–169)
Iron Saturation: 19 % (ref 15–55)
Total Iron Binding Capacity: 317 ug/dL (ref 250–450)
UIBC: 256 ug/dL (ref 111–343)

## 2018-02-25 LAB — CBC
Hematocrit: 42 % (ref 37.5–51.0)
Hemoglobin: 13.8 g/dL (ref 13.0–17.7)
MCH: 31 pg (ref 26.6–33.0)
MCHC: 32.9 g/dL (ref 31.5–35.7)
MCV: 94 fL (ref 79–97)
Platelets: 227 10*3/uL (ref 150–379)
RBC: 4.45 x10E6/uL (ref 4.14–5.80)
RDW: 14 % (ref 12.3–15.4)
WBC: 4.8 10*3/uL (ref 3.4–10.8)

## 2018-02-25 LAB — HIV ANTIBODY (ROUTINE TESTING W REFLEX): HIV Screen 4th Generation wRfx: NONREACTIVE

## 2018-03-02 ENCOUNTER — Telehealth: Payer: Self-pay

## 2018-03-02 NOTE — Telephone Encounter (Signed)
-----   Message from Claiborne RiggZelda W Fleming, NP sent at 02/28/2018  7:35 PM EST ----- All of your labs are normal. HIV test is negative.

## 2018-03-02 NOTE — Telephone Encounter (Signed)
CMA attempt to call patient regarding lab results.  No answer and left a voicemail for patient.  If patient call back, please inform:   All of your labs are normal. HIV test is negative.

## 2018-03-17 NOTE — Telephone Encounter (Signed)
eerror

## 2018-03-23 ENCOUNTER — Encounter (HOSPITAL_COMMUNITY): Payer: Self-pay | Admitting: Emergency Medicine

## 2018-03-23 ENCOUNTER — Encounter: Payer: Self-pay | Admitting: Nurse Practitioner

## 2018-03-23 ENCOUNTER — Other Ambulatory Visit: Payer: Self-pay

## 2018-03-23 DIAGNOSIS — R05 Cough: Secondary | ICD-10-CM | POA: Insufficient documentation

## 2018-03-23 DIAGNOSIS — Z79899 Other long term (current) drug therapy: Secondary | ICD-10-CM | POA: Insufficient documentation

## 2018-03-23 DIAGNOSIS — R112 Nausea with vomiting, unspecified: Secondary | ICD-10-CM | POA: Insufficient documentation

## 2018-03-23 DIAGNOSIS — R197 Diarrhea, unspecified: Secondary | ICD-10-CM | POA: Insufficient documentation

## 2018-03-23 LAB — CBC
HCT: 45.4 % (ref 39.0–52.0)
Hemoglobin: 15.1 g/dL (ref 13.0–17.0)
MCH: 30.8 pg (ref 26.0–34.0)
MCHC: 33.3 g/dL (ref 30.0–36.0)
MCV: 92.5 fL (ref 78.0–100.0)
PLATELETS: 176 10*3/uL (ref 150–400)
RBC: 4.91 MIL/uL (ref 4.22–5.81)
RDW: 13.5 % (ref 11.5–15.5)
WBC: 10.3 10*3/uL (ref 4.0–10.5)

## 2018-03-23 LAB — COMPREHENSIVE METABOLIC PANEL
ALBUMIN: 4.2 g/dL (ref 3.5–5.0)
ALK PHOS: 37 U/L — AB (ref 38–126)
ALT: 14 U/L — AB (ref 17–63)
AST: 20 U/L (ref 15–41)
Anion gap: 8 (ref 5–15)
BUN: 10 mg/dL (ref 6–20)
CALCIUM: 9.2 mg/dL (ref 8.9–10.3)
CO2: 24 mmol/L (ref 22–32)
CREATININE: 1.2 mg/dL (ref 0.61–1.24)
Chloride: 107 mmol/L (ref 101–111)
GFR calc non Af Amer: >60 mL/min (ref >60)
GLUCOSE: 91 mg/dL (ref 65–99)
Potassium: 3.6 mmol/L (ref 3.5–5.1)
SODIUM: 139 mmol/L (ref 135–145)
Total Bilirubin: 1.1 mg/dL (ref 0.3–1.2)
Total Protein: 7.6 g/dL (ref 6.5–8.1)

## 2018-03-23 LAB — LIPASE, BLOOD: Lipase: 24 U/L (ref 11–51)

## 2018-03-23 MED ORDER — ONDANSETRON 4 MG PO TBDP
4.0000 mg | ORAL_TABLET | Freq: Once | ORAL | Status: AC | PRN
  Administered 2018-03-23: 4 mg via ORAL
  Filled 2018-03-23: qty 1

## 2018-03-23 NOTE — ED Triage Notes (Signed)
Pt to ED with c/o generalized abd pain with nausea and vomiting onset yesterday also diarrhea

## 2018-03-24 ENCOUNTER — Emergency Department (HOSPITAL_COMMUNITY)
Admission: EM | Admit: 2018-03-24 | Discharge: 2018-03-24 | Disposition: A | Discharge status: Home / Self Care | Payer: BLUE CROSS/BLUE SHIELD | Attending: Emergency Medicine | Admitting: Emergency Medicine

## 2018-03-24 DIAGNOSIS — R059 Cough, unspecified: Secondary | ICD-10-CM

## 2018-03-24 DIAGNOSIS — R05 Cough: Secondary | ICD-10-CM

## 2018-03-24 DIAGNOSIS — R112 Nausea with vomiting, unspecified: Secondary | ICD-10-CM

## 2018-03-24 DIAGNOSIS — R197 Diarrhea, unspecified: Secondary | ICD-10-CM

## 2018-03-24 MED ORDER — ONDANSETRON 4 MG PO TBDP: 4.0000 mg | ORAL_TABLET | Freq: Four times a day (QID) | ORAL | 0 refills | Status: DC | PRN

## 2018-03-24 MED ORDER — LOPERAMIDE HCL 2 MG PO CAPS
2.0000 mg | ORAL_CAPSULE | Freq: Once | ORAL | Status: AC
  Administered 2018-03-24: 2 mg via ORAL
  Filled 2018-03-24: qty 1

## 2018-03-24 MED ORDER — ONDANSETRON 4 MG PO TBDP
4.0000 mg | ORAL_TABLET | Freq: Once | ORAL | Status: AC
  Administered 2018-03-24: 4 mg via ORAL
  Filled 2018-03-24: qty 1

## 2018-03-24 MED ORDER — IBUPROFEN 800 MG PO TABS
800.0000 mg | ORAL_TABLET | Freq: Once | ORAL | Status: AC
  Administered 2018-03-24: 800 mg via ORAL
  Filled 2018-03-24: qty 1

## 2018-03-24 NOTE — Discharge Instructions (Addendum)
You may alternate Tylenol 1000 mg every 6 hours as needed for pain and Ibuprofen 800 mg every 8 hours as needed for pain.  Please take Ibuprofen with food.  Please drink 60-80 ounces of water a day.  Please eat a bland diet for the next several days.  You may use over-the-counter Mucinex as needed for cough.  You may use over-the-counter Imodium as needed for diarrhea.

## 2018-03-24 NOTE — ED Provider Notes (Signed)
TIME SEEN: 4:05 AM  CHIEF COMPLAINT: Nausea, vomiting, diarrhea  HPI: Patient is a 37 year old male with no significant past medical history who presents to the emergency department with 3-4 days of subjective fevers, chills, nonproductive cough, body aches, nausea, vomiting and diarrhea.  No abdominal pain.  No previous abdominal surgery.  No dysuria or hematuria.  Did have an influenza vaccination this year.  No sick contacts or recent travel.  No headache, neck pain or neck stiffness.  No rash.  ROS: See HPI Constitutional: Subjective fever  Eyes: no drainage  ENT: no runny nose   Cardiovascular:  no chest pain  Resp: no SOB  GI: Diarrhea and vomiting GU: no dysuria Integumentary: no rash  Allergy: no hives  Musculoskeletal: no leg swelling  Neurological: no slurred speech ROS otherwise negative  PAST MEDICAL HISTORY/PAST SURGICAL HISTORY:  History reviewed. No pertinent past medical history.  MEDICATIONS:  Prior to Admission medications   Medication Sig Start Date End Date Taking? Authorizing Provider  amoxicillin-clavulanate (AUGMENTIN) 875-125 MG tablet Take 1 tablet by mouth 2 (two) times daily. 02/24/18   Claiborne RiggFleming, Zelda W, NP    ALLERGIES:  No Known Allergies  SOCIAL HISTORY:  Social History   Tobacco Use  . Smoking status: Never Smoker  . Smokeless tobacco: Never Used  . Tobacco comment: still smoking Marijuana   Substance Use Topics  . Alcohol use: Yes    Comment: occasionally     FAMILY HISTORY: Family History  Problem Relation Age of Onset  . Diabetes Father     EXAM: BP 138/85 (BP Location: Right Arm)   Pulse 79   Temp 99.8 F (37.7 C) (Oral)   Resp 16   Ht 5\' 7"  (1.702 m)   Wt 66.7 kg (147 lb)   SpO2 99%   BMI 23.02 kg/m  CONSTITUTIONAL: Alert and oriented and responds appropriately to questions. Well-appearing; well-nourished HEAD: Normocephalic EYES: Conjunctivae clear, pupils appear equal, EOMI ENT: normal nose; moist mucous  membranes NECK: Supple, no meningismus, no nuchal rigidity, no LAD  CARD: RRR; S1 and S2 appreciated; no murmurs, no clicks, no rubs, no gallops RESP: Normal chest excursion without splinting or tachypnea; breath sounds clear and equal bilaterally; no wheezes, no rhonchi, no rales, no hypoxia or respiratory distress, speaking full sentences ABD/GI: Normal bowel sounds; non-distended; soft, non-tender, no rebound, no guarding, no peritoneal signs, no hepatosplenomegaly BACK:  The back appears normal and is non-tender to palpation, there is no CVA tenderness EXT: Normal ROM in all joints; non-tender to palpation; no edema; normal capillary refill; no cyanosis, no calf tenderness or swelling    SKIN: Normal color for age and race; warm; no rash NEURO: Moves all extremities equally PSYCH: The patient's mood and manner are appropriate. Grooming and personal hygiene are appropriate.  MEDICAL DECISION MAKING: Patient here with likely viral gastroenteritis.  Abdominal exam is benign.  Labs are unremarkable including normal LFTs, lipase, creatinine.  No leukocytosis.  He is afebrile here.  We will treat symptomatically with Imodium, Zofran, Motrin.  Will fluid challenge.  I do not think he has appendicitis, colitis, diverticulitis, cholecystitis, pancreatitis, bowel obstruction.  His lungs are clear.  Doubt pneumonia.  Doubt meningitis.  He did report having an influenza vaccination this year.  He is outside treatment window for Tamiflu.   ED PROGRESS: Patient reports feeling much better.  He has been able to drink without difficulty.  Will discharge with prescriptions of Zofran for nausea and vomiting.  Recommended Tylenol and Motrin  for fever, pain.  Recommended Imodium over-the-counter for diarrhea.  Will provide with work note.  I feel patient is safe for discharge home.  He is comfortable with this plan as well.  Has a PCP for follow-up as needed.  At this time, I do not feel there is any  life-threatening condition present. I have reviewed and discussed all results (EKG, imaging, lab, urine as appropriate) and exam findings with patient/family. I have reviewed nursing notes and appropriate previous records.  I feel the patient is safe to be discharged home without further emergent workup and can continue workup as an outpatient as needed. Discussed usual and customary return precautions. Patient/family verbalize understanding and are comfortable with this plan.  Outpatient follow-up has been provided if needed. All questions have been answered.      Enmanuel Zufall, Layla Maw, DO 03/24/18 [phone]

## 2018-05-13 ENCOUNTER — Other Ambulatory Visit: Payer: Self-pay

## 2018-05-13 ENCOUNTER — Encounter (HOSPITAL_COMMUNITY): Payer: Self-pay | Admitting: Emergency Medicine

## 2018-05-13 DIAGNOSIS — M25522 Pain in left elbow: Secondary | ICD-10-CM | POA: Diagnosis present

## 2018-05-13 DIAGNOSIS — M7712 Lateral epicondylitis, left elbow: Secondary | ICD-10-CM | POA: Diagnosis not present

## 2018-05-13 NOTE — ED Triage Notes (Addendum)
Pt from home with c/o left arm pain that starts in wrist and shoots to elbow. Pt reports pins and needles feeling from elbow down. Pt denies trauma or injury. No deformity noted. Pt works at a [employer] and does lots of heavy lifting/repetitive motion. Pt states he has had tendonitis in same elbow and this feels similar but worse

## 2018-05-13 NOTE — ED Notes (Signed)
Pt called to triage x1 with no answer. 

## 2018-05-14 ENCOUNTER — Emergency Department (HOSPITAL_COMMUNITY)
Admission: EM | Admit: 2018-05-14 | Discharge: 2018-05-14 | Disposition: A | Discharge status: Home / Self Care | Payer: BLUE CROSS/BLUE SHIELD | Attending: Emergency Medicine | Admitting: Emergency Medicine

## 2018-05-14 DIAGNOSIS — M7712 Lateral epicondylitis, left elbow: Secondary | ICD-10-CM

## 2018-05-14 MED ORDER — NAPROXEN 500 MG PO TABS: 500.0000 mg | ORAL_TABLET | Freq: Two times a day (BID) | ORAL | 0 refills | Status: DC

## 2018-05-14 NOTE — ED Notes (Signed)
Bed: WTR6 Expected date:  Expected time:  Means of arrival:  Comments: 

## 2018-05-14 NOTE — Discharge Instructions (Addendum)
1. Medications: take naprosyn, usual home medications 2. Treatment: rest, ice, elevate and use brace, drink plenty of fluids, gentle stretching 3. Follow Up: Please followup with orthopedics as directed or your PCP in 1 week if no improvement for discussion of your diagnoses and further evaluation after today's visit; if you do not have a primary care doctor use the resource guide provided to find one; Please return to the ER for worsening symptoms or other concerns

## 2018-05-14 NOTE — ED Provider Notes (Signed)
[facility]-EMERGENCY DEPT Provider Note   CSN: [id number] Arrival date & time: 05/13/18  2204     History   Chief Complaint Chief Complaint  Patient presents with  . Arm Pain    HPI Charles Schultz is a 37 y.o. male with a hx of no major medical problems presents to the Emergency Department complaining of gradual, persistent, pain in the left elbow which radiates to the left wrist onset approximately 3 days ago.  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  He reports this work is repetitive.  He states he noticed the pain after work several days ago.  He has tried heat and ice without improvement.  Patient also reports that he sleeps on his left side and often bends his elbow through much of the night.  Patient reports that using the grinder at work makes his pain significantly worse.  Flexion and extension with resistance of the wrist also makes his pain worse.  Nothing seems to make it better.  Patient reports intermittent paresthesias of the left hand without weakness.  No loss of sensation in the left hand.    The history is provided by the patient and medical records. No language interpreter was used.    History reviewed. No pertinent past medical history.  Patient Active Problem List   Diagnosis Date Noted  . Sialoadenitis 02/24/2018    History reviewed. No pertinent surgical history.      Home Medications    Prior to Admission medications   Medication Sig Start Date End Date Taking? Authorizing Provider  amoxicillin-clavulanate (AUGMENTIN) 875-125 MG tablet Take 1 tablet by mouth 2 (two) times daily. 02/24/18   Claiborne Rigg, NP  naproxen (NAPROSYN) 500 MG tablet Take 1 tablet (500 mg total) by mouth 2 (two) times daily with a meal. 05/14/18   Ferguson Gertner, Dahlia Client, PA-C  ondansetron (ZOFRAN ODT) 4 MG disintegrating tablet Take 1 tablet (4 mg total) by mouth every 6 (six) hours as needed.  03/24/18   Ward, Layla Maw, DO    Family History Family History  Problem Relation Age of Onset  . Diabetes Father     Social History Social History   Tobacco Use  . Smoking status: Never Smoker  . Smokeless tobacco: Never Used  . Tobacco comment: still smoking Marijuana   Substance Use Topics  . Alcohol use: Yes    Comment: occasionally   . Drug use: Yes    Types: Marijuana     Allergies   Patient has no known allergies.   Review of Systems Review of Systems  Constitutional: Negative for chills and fever.  Gastrointestinal: Negative for nausea and vomiting.  Musculoskeletal: Positive for arthralgias. Negative for back pain, joint swelling, neck pain and neck stiffness.  Skin: Negative for wound.  Neurological: Negative for numbness.       Intermittent paresthesias  Hematological: Does not bruise/bleed easily.  Psychiatric/Behavioral: The patient is not nervous/anxious.   All other systems reviewed and are negative.    Physical Exam Updated Vital Signs BP 109/69 (BP Location: Right Arm)   Pulse 60   Temp (!) 97.5 F (36.4 C) (Oral)   Resp 14   Ht  (1.702 m)   Wt 58.5 kg (128 lb 14.4 oz)   SpO2 99%   BMI 20.19 kg/m   Physical Exam  Constitutional: He appears well-developed and well-nourished. No distress.  HENT:  Head: Normocephalic and atraumatic.  Eyes: Conjunctivae are normal.  Neck: Normal range of motion.  Cardiovascular: Normal rate, regular rhythm and intact distal pulses.  Capillary refill < 3 sec  Pulmonary/Chest: Effort normal and breath sounds normal.  Musculoskeletal: He exhibits tenderness. He exhibits no edema.  Full range of motion of the left shoulder, elbow, wrist and all fingers of the left hand.  Full pronation and supination. Tenderness to palpation over the lateral epicondyle with reproduction of paresthesias and pain.  Minimal tenderness over the medial epicondyle without paresthesias or radiation.  Neurological: He is alert.  Coordination normal.  Sensation intact to normal touch throughout the entire left upper extremity and including the radial, median and ulnar distributions of the hand. Strength 5/5 in the left upper extremity including flexion extension of the elbow, wrist and fingers along with strong grip strength  Skin: Skin is warm and dry. He is not diaphoretic.  No tenting of the skin  Psychiatric: He has a normal mood and affect.  Nursing note and vitals reviewed.    ED Treatments / Results   Procedures Procedures (including critical care time)  Medications Ordered in ED Medications - No data to display   Initial Impression / Assessment and Plan / ED Course  I have reviewed the triage vital signs and the nursing notes.  Pertinent labs & imaging results that were available during my care of the patient were reviewed by me and considered in my medical decision making (see chart for details).     Patient presents with tendinitis.  No injury to suggest acute fracture.  No imaging indicated at this time.  Paresthesias are reproducible.  No sensation deficits or weakness.  No evidence of a radial nerve palsy.  Will give anti-inflammatories and write patient out of work for 1 week.  He is to follow with orthopedics if symptoms persist.  Patient states understanding and is in agreement with the plan.  Final Clinical Impressions(s) / ED Diagnoses   Final diagnoses:  Lateral epicondylitis of left elbow    ED Discharge Orders        Ordered    naproxen (NAPROSYN) 500 MG tablet  2 times daily with meals     05/14/18 0321       Mikenzi Raysor, [place], PA-C 05/14/18 4098    Zadie Rhine, MD 05/14/18 2320

## 2018-05-27 ENCOUNTER — Encounter (INDEPENDENT_AMBULATORY_CARE_PROVIDER_SITE_OTHER): Payer: Self-pay | Admitting: Nurse Practitioner

## 2018-05-27 ENCOUNTER — Ambulatory Visit (INDEPENDENT_AMBULATORY_CARE_PROVIDER_SITE_OTHER): Payer: BLUE CROSS/BLUE SHIELD | Admitting: Nurse Practitioner

## 2018-05-27 VITALS — BP 107/64 | HR 59 | Temp 98.0°F | Ht 67.0 in | Wt 124.2 lb

## 2018-05-27 DIAGNOSIS — M7712 Lateral epicondylitis, left elbow: Secondary | ICD-10-CM | POA: Diagnosis not present

## 2018-05-27 DIAGNOSIS — K1120 Sialoadenitis, unspecified: Secondary | ICD-10-CM | POA: Diagnosis not present

## 2018-05-27 MED ORDER — NAPROXEN 500 MG PO TABS: 500.0000 mg | ORAL_TABLET | Freq: Two times a day (BID) | ORAL | 0 refills | Status: DC

## 2018-05-27 MED ORDER — AMOXICILLIN-POT CLAVULANATE 875-125 MG PO TABS: 1.0000 | ORAL_TABLET | Freq: Two times a day (BID) | ORAL | 0 refills | Status: DC

## 2018-05-27 NOTE — Progress Notes (Signed)
Assessment & Plan:  Charles Schultz was seen today for oral swelling.  Diagnoses and all orders for this visit:  Sialoadenitis -     amoxicillin-clavulanate (AUGMENTIN) 875-125 MG tablet; Take 1 tablet by mouth 2 (two) times daily. He was instructed to stop smoking marijuana as this may be contributing to his clogged salivary glands.   Instructed to rinse mouth at least 2 times a day with warm salt water gargles. Apply heat to affected area to help open salivary ducts  Lateral epicondylitis of left elbow -     naproxen (NAPROSYN) 500 MG tablet; Take 1 tablet (500 mg total) by mouth 2 (two) times daily with a meal. Seen in the emergency room on 05/14/2018 for lateral epicondylitis of the left elbow.  He was treated with naproxen 500 mg twice daily and discharged home with recommendations to follow-up with orthopedic surgery in 1 week if needed or if symptoms have worsened.  Today he reports decreased pain in his left elbow. Will refill naproxen today and patient should be able to return to full duty at work in 1 week.  Patient has been counseled on age-appropriate routine health concerns for screening and prevention. These are reviewed and up-to-date. Referrals have been placed accordingly. Immunizations are up-to-date or declined.    Subjective:   Chief Complaint  Patient presents with  . Oral Swelling    HPI Charles Schultz 37 y.o. male presents to office today with recurrent Sialoadenitis.   Sialoadenitis Onset last week. He reports going out of town to [place] and was smoking marijuana excessively while he was there. When he returned he noticed increased swelling and tenderness on the left side of his jaw. He has a history of sialoadenitis with effective relief from Augmentin.   Review of Systems  Constitutional: Negative for fever, malaise/fatigue and weight loss.  HENT: Negative.  Negative for nosebleeds.        See hpi  Eyes: Negative.  Negative for blurred vision, double vision and  photophobia.  Respiratory: Negative.  Negative for cough and shortness of breath.   Cardiovascular: Negative.  Negative for chest pain, palpitations and leg swelling.  Gastrointestinal: Negative.  Negative for heartburn, nausea and vomiting.  Musculoskeletal: Negative.  Negative for myalgias.  Neurological: Negative.  Negative for dizziness, focal weakness, seizures and headaches.  Psychiatric/Behavioral: Negative.  Negative for suicidal ideas.    History reviewed. No pertinent past medical history.  History reviewed. No pertinent surgical history.  Family History  Problem Relation Age of Onset  . Diabetes Father     Social History Reviewed with no changes to be made today.   Outpatient Medications Prior to Visit  Medication Sig Dispense Refill  . ondansetron (ZOFRAN ODT) 4 MG disintegrating tablet Take 1 tablet (4 mg total) by mouth every 6 (six) hours as needed. 20 tablet 0  . amoxicillin-clavulanate (AUGMENTIN) 875-125 MG tablet Take 1 tablet by mouth 2 (two) times daily. 20 tablet 0  . naproxen (NAPROSYN) 500 MG tablet Take 1 tablet (500 mg total) by mouth 2 (two) times daily with a meal. 30 tablet 0   No facility-administered medications prior to visit.     No Known Allergies     Objective:    BP 107/64 (BP Location: Left Arm, Patient Position: Sitting, Cuff Size: Normal)   Pulse (!) 59   Temp 98 F (36.7 C) (Oral)   Ht  (1.702 m)   Wt 124 lb 3.2 oz (56.3 kg)   SpO2 97%  BMI 19.45 kg/m  Wt Readings from Last 3 Encounters:  05/27/18 124 lb 3.2 oz (56.3 kg)  05/13/18 128 lb 14.4 oz (58.5 kg)  03/23/18 147 lb (66.7 kg)    Physical Exam  Constitutional: He is oriented to person, place, and time. He appears well-developed and well-nourished. He is cooperative.  HENT:  Head: Normocephalic and atraumatic.    Mouth/Throat: Uvula is midline, oropharynx is clear and moist and mucous membranes are normal. Mucous membranes are not cyanotic. Abnormal dentition.    Eyes: EOM are normal.  Neck: Normal range of motion.  Cardiovascular: Normal rate, regular rhythm and normal heart sounds. Exam reveals no gallop and no friction rub.  No murmur heard. Pulmonary/Chest: Effort normal and breath sounds normal. No tachypnea. No respiratory distress. He has no decreased breath sounds. He has no wheezes. He has no rhonchi. He has no rales. He exhibits no tenderness.  Abdominal: Soft. Bowel sounds are normal.  Musculoskeletal: Normal range of motion. He exhibits no edema.  Lymphadenopathy:       Head (left side): Submandibular and tonsillar adenopathy present.    He has no cervical adenopathy.  Neurological: He is alert and oriented to person, place, and time. Coordination normal.  Skin: Skin is warm and dry.  Psychiatric: He has a normal mood and affect. His behavior is normal. Judgment and thought content normal.  Nursing note and vitals reviewed.      Patient has been counseled extensively about nutrition and exercise as well as the importance of adherence with medications and regular follow-up. The patient was given clear instructions to go to ER or return to medical center if symptoms don't improve, worsen or new problems develop. The patient verbalized understanding.   Follow-up: Return if symptoms worsen or fail to improve.   Claiborne Rigg, FNP-BC [facility] [place], [place] [phone]   05/27/2018, 4:34 PM

## 2018-05-27 NOTE — Patient Instructions (Signed)
Tennis Elbow Tennis elbow is puffiness (inflammation) of the outer tendons of your forearm close to your elbow. Your tendons attach your muscles to your bones. Tennis elbow can happen in any sport or job in which you use your elbow too much. It is caused by doing the same motion over and over. Tennis elbow can cause:  Pain and tenderness in your forearm and the outer part of your elbow.  A burning feeling. This runs from your elbow through your arm.  Weak grip in your hands.  Follow these instructions at home: Activity  Rest your elbow and wrist as told by your doctor. Try to avoid any activities that caused the problem until your doctor says that you can do them again.  If a physical therapist teaches you exercises, do all of them as told.  If you lift an object, lift it with your palm facing up. This is easier on your elbow. Lifestyle  If your tennis elbow is caused by sports, check your equipment and make sure that: ? You are using it correctly. ? It fits you well.  If your tennis elbow is caused by work, take breaks often, if you are able. Talk with your manager about doing your work in a way that is safe for you. ? If your tennis elbow is caused by computer use, talk with your manager about any changes that can be made to your work setup. General instructions  If told, apply ice to the painful area: ? Put ice in a plastic bag. ? Place a towel between your skin and the bag. ? Leave the ice on for 20 minutes, 2-3 times per day.  Take medicines only as told by your doctor.  If you were given a brace, wear it as told by your doctor.  Keep all follow-up visits as told by your doctor. This is important. Contact a doctor if:  Your pain does not get better with treatment.  Your pain gets worse.  You have weakness in your forearm, hand, or fingers.  You cannot feel your forearm, hand, or fingers. This information is not intended to replace advice given to you by your health  care provider. Make sure you discuss any questions you have with your health care provider. Document Released: 06/04/2010 Document Revised: 08/14/2016 Document Reviewed: 12/11/2014 Elsevier Interactive Patient Education  2018 Elsevier Inc.  

## 2018-06-06 IMAGING — US US SOFT TISSUE HEAD/NECK
1 series · 14 of 25 positions shown · non-contrast
Comparison: None.

CLINICAL DATA: 36-year-old male with a history of submandibular
gland swelling for 1 month

EXAM:
ULTRASOUND OF HEAD/NECK SOFT TISSUES
TECHNIQUE: Ultrasound examination of the head and neck soft tissues was
performed in the area of clinical concern.

[Series 1: us soft tissue head/neck · 0.07mm/px · 30 acquisitions, 14 frames shown]
[im 1/30]
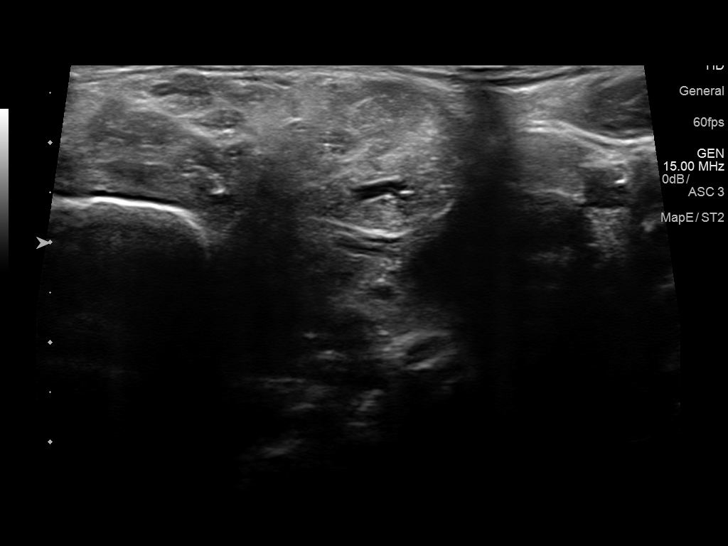
[im 3/30]
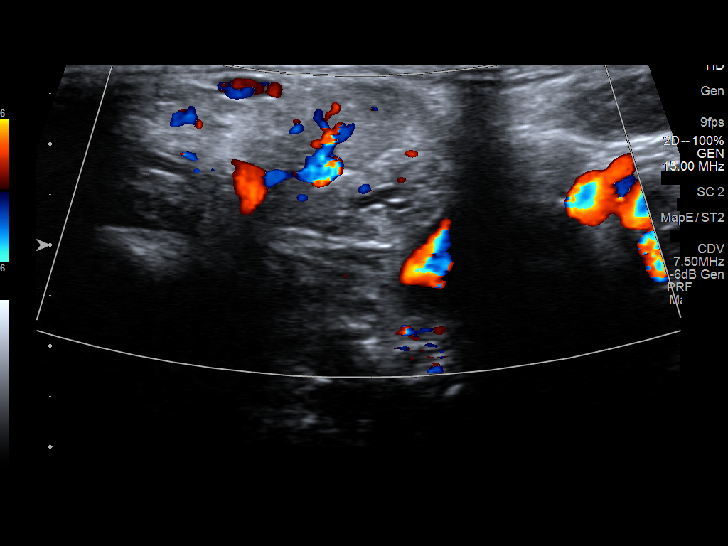
[im 5/30]
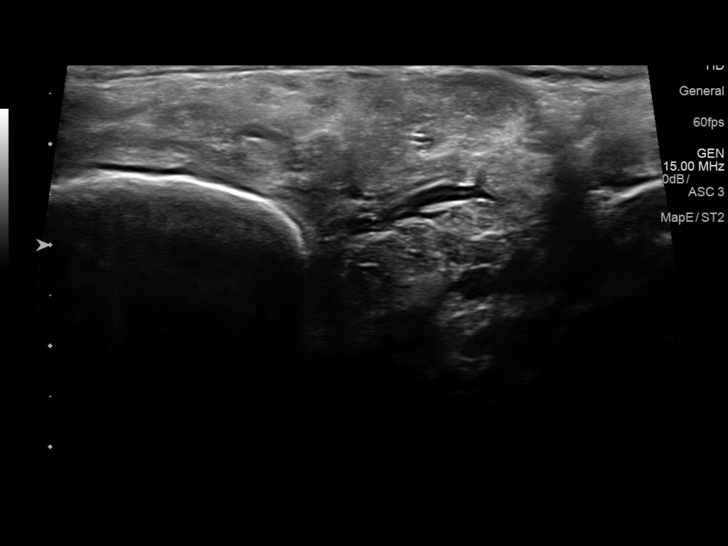
[im 8/30]
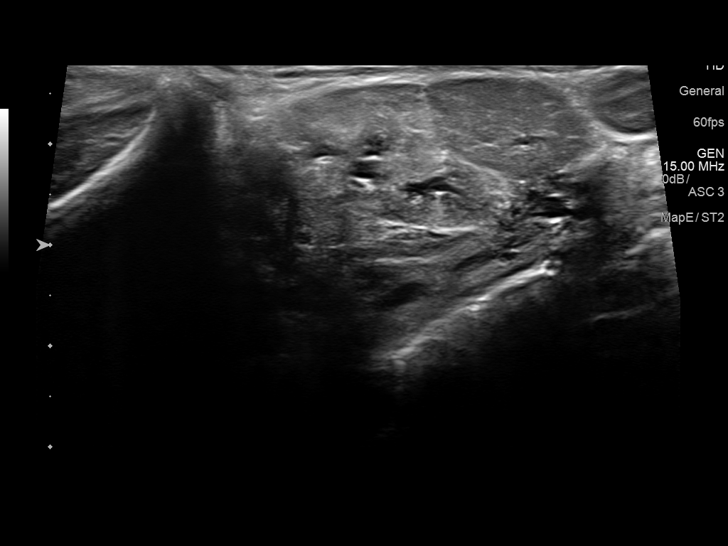
[im 10/30]
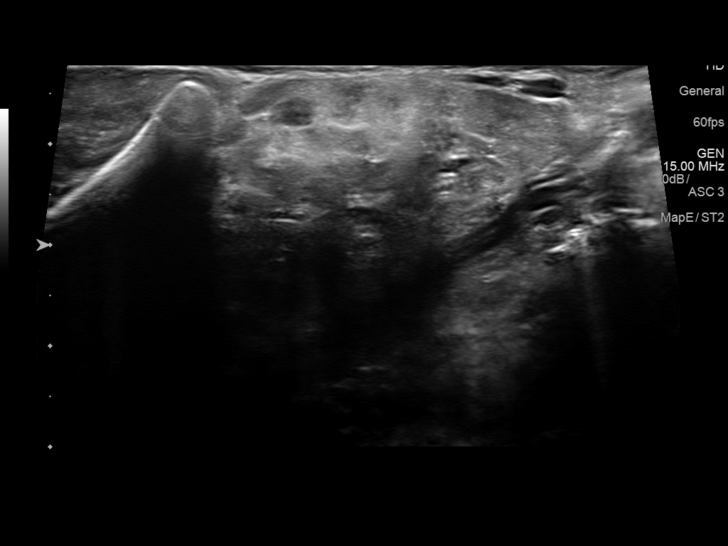
[im 11/30]
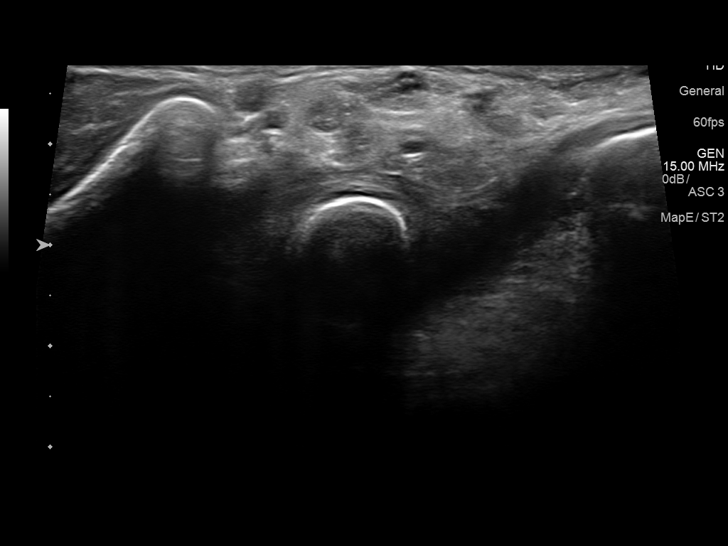
[im 14/30]
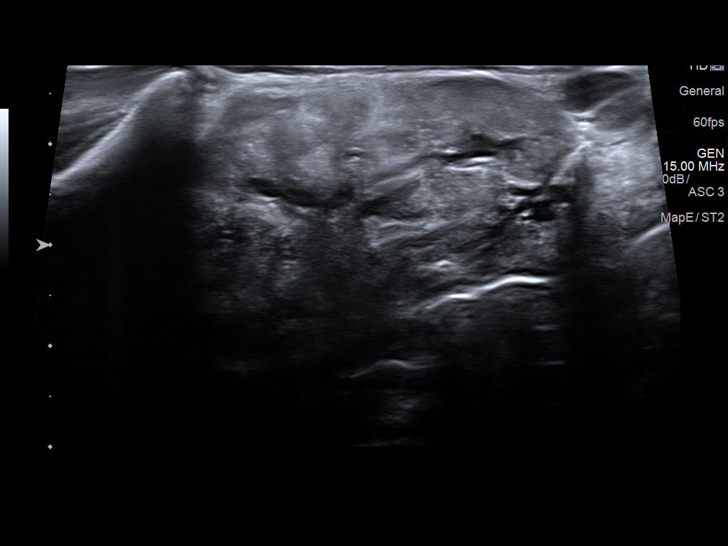
[im 16/30]
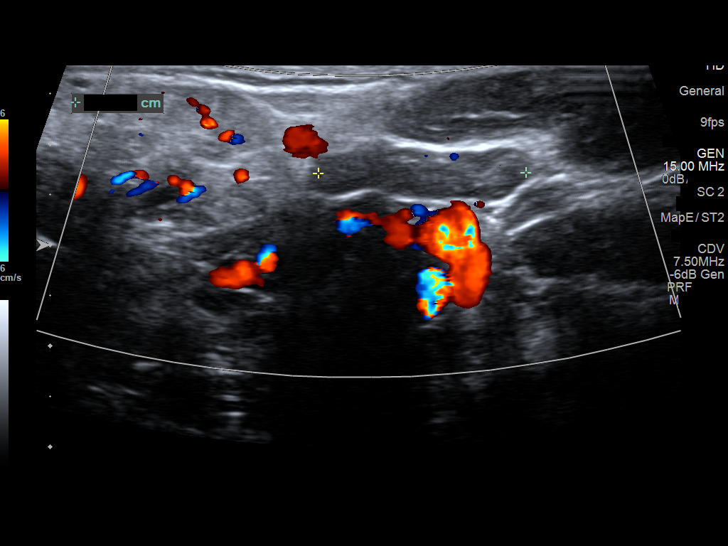
[im 19/30]
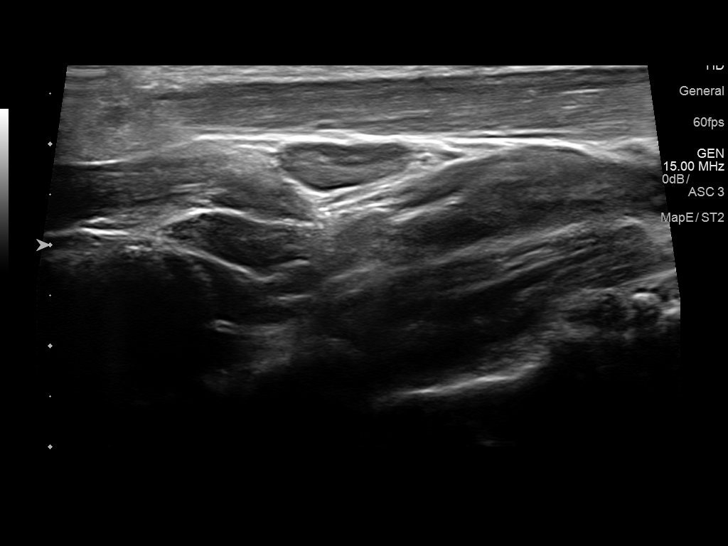
[im 20/30]
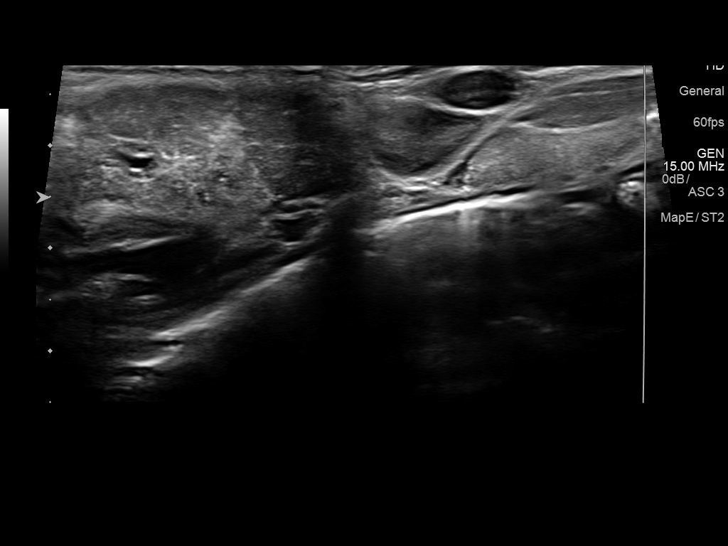
[im 22/30]
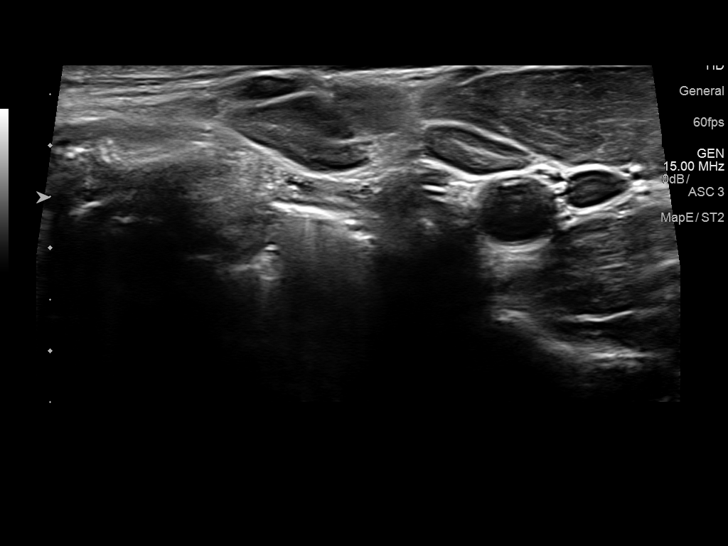
[im 25/30]
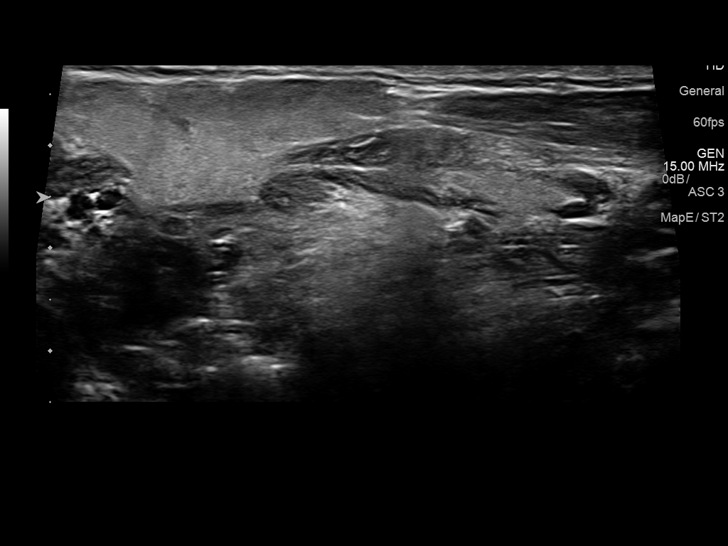
[im 27/30]
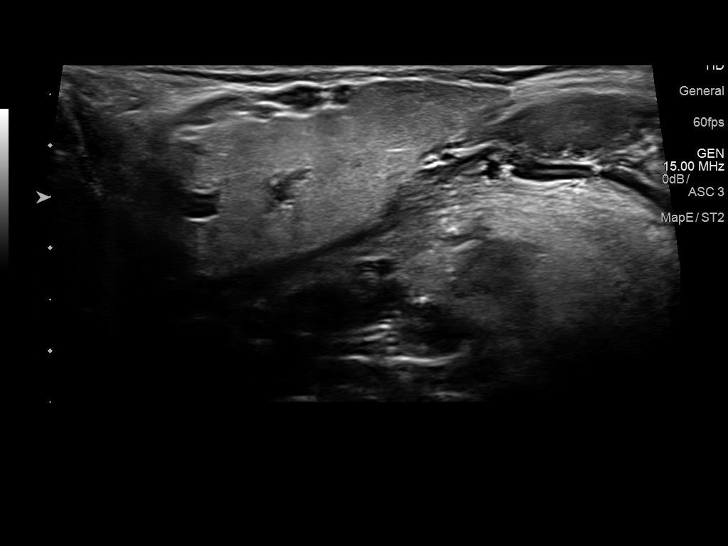
[im 30/30]
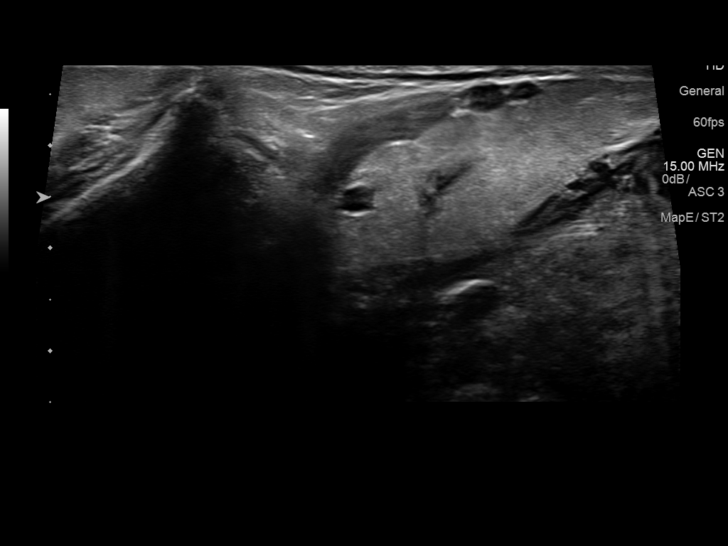

[14 of 25 positions shown; findings below may reference images not displayed]

FINDINGS: Grayscale and color duplex ultrasound performed in the region of
clinical concern.

Heterogeneous appearance of the submandibular gland.

Several lymph nodes present adjacent to the gland, borderline
enlarged.

No focal fluid collection.
IMPRESSION: Ultrasound survey in the region of the left submandibular gland
suggests sialoadenitis and reactive lymphadenopathy.

## 2018-09-02 ENCOUNTER — Other Ambulatory Visit
Admission: RE | Admit: 2018-09-02 | Discharge: 2018-09-02 | Disposition: A | Discharge status: Home / Self Care | Payer: BLUE CROSS/BLUE SHIELD | Source: Ambulatory Visit | Attending: Genetics | Admitting: Genetics

## 2018-09-02 LAB — BILIRUBIN, TOTAL: BILIRUBIN TOTAL: 0.9 mg/dL (ref 0.3–1.2)

## 2018-09-06 ENCOUNTER — Other Ambulatory Visit
Admission: RE | Admit: 2018-09-06 | Discharge: 2018-09-06 | Disposition: A | Discharge status: Home / Self Care | Payer: Self-pay | Source: Ambulatory Visit | Attending: *Deleted | Admitting: *Deleted

## 2018-09-09 ENCOUNTER — Other Ambulatory Visit: Payer: Self-pay

## 2018-09-09 ENCOUNTER — Encounter (HOSPITAL_COMMUNITY): Payer: Self-pay | Admitting: Emergency Medicine

## 2018-09-09 ENCOUNTER — Emergency Department (HOSPITAL_COMMUNITY)
Admission: EM | Admit: 2018-09-09 | Discharge: 2018-09-09 | Disposition: A | Discharge status: Home / Self Care | Payer: Self-pay | Attending: Emergency Medicine | Admitting: Emergency Medicine

## 2018-09-09 DIAGNOSIS — K122 Cellulitis and abscess of mouth: Secondary | ICD-10-CM | POA: Insufficient documentation

## 2018-09-09 DIAGNOSIS — R590 Localized enlarged lymph nodes: Secondary | ICD-10-CM | POA: Insufficient documentation

## 2018-09-09 DIAGNOSIS — R591 Generalized enlarged lymph nodes: Secondary | ICD-10-CM

## 2018-09-09 MED ORDER — NAPROXEN 500 MG PO TABS: 500.0000 mg | ORAL_TABLET | Freq: Two times a day (BID) | ORAL | 0 refills | Status: DC

## 2018-09-09 MED ORDER — AMOXICILLIN 500 MG PO CAPS: 500.0000 mg | ORAL_CAPSULE | Freq: Three times a day (TID) | ORAL | 0 refills | Status: DC

## 2018-09-09 NOTE — Discharge Instructions (Addendum)
We are are treating you for infection. Keep your follow up appointment with the ENT doctor. Return here if symptoms worsen.

## 2018-09-09 NOTE — ED Triage Notes (Signed)
Pt reports he has history of blocked gland and having sore throat since yesterday. Reports happened three other times and waiting to get in to ENT.

## 2018-09-09 NOTE — ED Provider Notes (Signed)
[facility]-EMERGENCY DEPT Provider Note   CSN: [id number] Arrival date & time: 09/09/18  1758     History   Chief Complaint Chief Complaint  Patient presents with  . Sore Throat    HPI Hortencia ConradiLarry D Schultz is a 37 y.o. male who presents to the ED with a sore throat. Patient reports hx of blocked gland and has to be treated with antibiotics. Patient states that has happened 3 times in the past and he is waiting to see the ENT. Patient did call his PCP and was told to come to the ED to get Amoxicillin.   HPI  History reviewed. No pertinent past medical history.  Patient Active Problem List   Diagnosis Date Noted  . Sialoadenitis 02/24/2018    History reviewed. No pertinent surgical history.      Home Medications    Prior to Admission medications   Medication Sig Start Date End Date Taking? Authorizing Provider  amoxicillin (AMOXIL) 500 MG capsule Take 1 capsule (500 mg total) by mouth 3 (three) times daily. 09/09/18   Janne NapoleonNeese, Hope M, NP  naproxen (NAPROSYN) 500 MG tablet Take 1 tablet (500 mg total) by mouth 2 (two) times daily. 09/09/18   Janne NapoleonNeese, Hope M, NP  ondansetron (ZOFRAN ODT) 4 MG disintegrating tablet Take 1 tablet (4 mg total) by mouth every 6 (six) hours as needed. 03/24/18   Ward, Layla MawKristen N, DO    Family History Family History  Problem Relation Age of Onset  . Diabetes Father     Social History Social History   Tobacco Use  . Smoking status: Never Smoker  . Smokeless tobacco: Never Used  . Tobacco comment: still smoking Marijuana   Substance Use Topics  . Alcohol use: Yes    Comment: occasionally   . Drug use: Yes    Types: Marijuana     Allergies   Patient has no known allergies.   Review of Systems Review of Systems  HENT: Positive for sore throat.        Sore place in mouth  Hematological: Positive for adenopathy.  All other systems reviewed and are negative.    Physical Exam Updated Vital Signs BP 138/83 (BP  Location: Left Arm)   Pulse 85   Temp 98 F (36.7 C) (Oral)   Resp 16   Ht 5\' 7"  (1.702 m)   Wt 67.1 kg   SpO2 100%   BMI 23.18 kg/m   Physical Exam  Constitutional: He appears well-developed and well-nourished. No distress.  HENT:  Head: Normocephalic.  Mouth/Throat: Oropharynx is clear and moist.    There is a raised area to the mucous membrane on the left side of the mouth under the tongue. There area is tender and has started to drain yellow drainage.   Eyes: Conjunctivae and EOM are normal.  Neck: Neck supple.  Pulmonary/Chest: Effort normal.  Abdominal: Soft. There is no tenderness.  Musculoskeletal: Normal range of motion.  Lymphadenopathy:    He has cervical adenopathy.  Neurological: He is alert.  Skin: Skin is warm and dry.  Nursing note and vitals reviewed.    ED Treatments / Results  Labs (all labs ordered are listed, but only abnormal results are displayed) Labs Reviewed - No data to display Radiology No results found.  Procedures Procedures (including critical care time)  Medications Ordered in ED Medications - No data to display   Initial Impression / Assessment and Plan / ED Course  I have reviewed the triage vital  signs and the nursing notes. 37 y.o. male here with recurrent gland swelling, mouth pain and left side throat pain stable for d/c without tonsillar abscess and does not appear toxic. Will treat with antibiotics and he will f/u with ENT as scheduled. Return precautions discussed.   Final Clinical Impressions(s) / ED Diagnoses   Final diagnoses:  Lymphadenopathy  Infection of mouth    ED Discharge Orders         Ordered    amoxicillin (AMOXIL) 500 MG capsule  3 times daily     09/09/18 2026    naproxen (NAPROSYN) 500 MG tablet  2 times daily     09/09/18 2026           Kerrie Buffalo Alligator, [place] 09/09/18 2033    Linwood Dibbles, MD 09/12/18 [phone]

## 2018-12-16 ENCOUNTER — Other Ambulatory Visit: Payer: Self-pay

## 2018-12-16 ENCOUNTER — Encounter (HOSPITAL_COMMUNITY): Payer: Self-pay | Admitting: Family Medicine

## 2018-12-16 ENCOUNTER — Ambulatory Visit (HOSPITAL_COMMUNITY)
Admission: EM | Admit: 2018-12-16 | Discharge: 2018-12-16 | Disposition: A | Discharge status: Home / Self Care | Payer: Self-pay | Attending: Family Medicine | Admitting: Family Medicine

## 2018-12-16 DIAGNOSIS — K1120 Sialoadenitis, unspecified: Secondary | ICD-10-CM | POA: Insufficient documentation

## 2018-12-16 MED ORDER — AMOXICILLIN 500 MG PO CAPS: 500.0000 mg | ORAL_CAPSULE | Freq: Three times a day (TID) | ORAL | 0 refills | Status: DC

## 2018-12-16 NOTE — ED Triage Notes (Signed)
Per Dr. Armond HangLauenstien, pt suffers from recurrent salivary swelling.  Dr. Armond HangLauenstien at the bedside.

## 2018-12-16 NOTE — ED Provider Notes (Signed)
[facility]    CSN: [id number] Arrival date & time: 12/16/18  1033     History   Chief Complaint Chief Complaint  Patient presents with  . Facial Swelling    HPI Charles Schultz is a 37 y.o. male.   This is a 37 year old gentleman who comes in with a swollen submandibular salivary gland.  [facility].  This started to swell again 4 days ago.  It was initially fairly tender but now it is better.  He has been sucking on lemon drops.  He has had no fever. Patient works third shift at [employer]  Note from September 12 in [facility] room [facility]: Charles Schultz is a 37 y.o. male who presents to the ED with a sore throat. Patient reports hx of blocked gland and has to be treated with antibiotics. Patient states that has happened 3 times in the past and he is waiting to see the ENT. Patient did call his PCP and was told to come to the ED to get Amoxicillin.         History reviewed. No pertinent past medical history.  Patient Active Problem List   Diagnosis Date Noted  . Sialoadenitis 02/24/2018    History reviewed. No pertinent surgical history.     Home Medications    Prior to Admission medications   Medication Sig Start Date End Date Taking? Authorizing Provider  amoxicillin (AMOXIL) 500 MG capsule Take 1 capsule (500 mg total) by mouth 3 (three) times daily. 12/16/18   Elvina SidleLauenstein, Julane Crock, MD  naproxen (NAPROSYN) 500 MG tablet Take 1 tablet (500 mg total) by mouth 2 (two) times daily. 09/09/18   Janne NapoleonNeese, Hope M, NP    Family History Family History  Problem Relation Age of Onset  . Diabetes Father     Social History Social History   Tobacco Use  . Smoking status: Never Smoker  . Smokeless tobacco: Never Used  . Tobacco comment: still smoking Marijuana   Substance Use Topics  . Alcohol use: Yes    Comment: occasionally   . Drug use: Yes    Types: Marijuana     Allergies   Patient has no known  allergies.   Review of Systems Review of Systems   Physical Exam Triage Vital Signs ED Triage Vitals  Enc Vitals Group     BP      Pulse      Resp      Temp      Temp src      SpO2      Weight      Height      Head Circumference      Peak Flow      Pain Score      Pain Loc      Pain Edu?      Excl. in GC?    No data found.  Updated Vital Signs BP 120/86 (BP Location: Right Arm)   Pulse 91   Temp 98.1 F (36.7 C) (Oral)   Resp 16   SpO2 94%    Physical Exam Vitals signs and nursing note reviewed.  Constitutional:      Appearance: Normal appearance.  HENT:     Head:     Comments: 1 cm left submandibular salivary gland to palpable in the floor of the mouth adjacent to tooth #18    Mouth/Throat:     Mouth: Mucous membranes are moist.  Pharynx: Oropharynx is clear.  Eyes:     Conjunctiva/sclera: Conjunctivae normal.  Neck:     Musculoskeletal: Normal range of motion and neck supple.     Comments: Swollen submandibular gland left side Pulmonary:     Effort: Pulmonary effort is normal.  Musculoskeletal: Normal range of motion.  Lymphadenopathy:     Cervical: Cervical adenopathy present.  Skin:    General: Skin is warm.  Neurological:     General: No focal deficit present.     Mental Status: He is alert.  Psychiatric:        Mood and Affect: Mood normal.      UC Treatments / Results  Labs (all labs ordered are listed, but only abnormal results are displayed) Labs Reviewed - No data to display  EKG None  Radiology No results found.  Procedures Procedures (including critical care time)  Medications Ordered in UC Medications - No data to display  Initial Impression / Assessment and Plan / UC Course  I have reviewed the triage vital signs and the nursing notes.  Pertinent labs & imaging results that were available during my care of the patient were reviewed by me and considered in my medical decision making (see chart for details).      Final Clinical Impressions(s) / UC Diagnoses   Final diagnoses:  Sialoadenitis     Discharge Instructions     Sour drops or lemon slices will help    ED Prescriptions    Medication Sig Dispense Auth. Provider   amoxicillin (AMOXIL) 500 MG capsule Take 1 capsule (500 mg total) by mouth 3 (three) times daily. 30 capsule Elvina SidleLauenstein, Leanard Dimaio, MD     Controlled Substance Prescriptions [place] Controlled Substance Registry consulted? Not Applicable   Elvina SidleLauenstein, Nefertari Rebman, MD 12/16/18 1213

## 2018-12-16 NOTE — Discharge Instructions (Signed)
Sour drops or lemon slices will help

## 2019-01-07 ENCOUNTER — Ambulatory Visit: Payer: Self-pay | Attending: Nurse Practitioner | Admitting: Nurse Practitioner

## 2019-01-07 ENCOUNTER — Encounter: Payer: Self-pay | Admitting: Nurse Practitioner

## 2019-01-07 VITALS — BP 106/69 | HR 57 | Temp 97.7°F | Ht 67.0 in | Wt 138.4 lb

## 2019-01-07 DIAGNOSIS — K1120 Sialoadenitis, unspecified: Secondary | ICD-10-CM

## 2019-01-07 DIAGNOSIS — R591 Generalized enlarged lymph nodes: Secondary | ICD-10-CM

## 2019-01-07 MED ORDER — IBUPROFEN 800 MG PO TABS: 800.0000 mg | ORAL_TABLET | Freq: Three times a day (TID) | ORAL | 0 refills | Status: DC | PRN

## 2019-01-07 MED FILL — IBUPROFEN 800 MG TABLET: 800 | 20 days supply | Qty: 60 | Fill #0

## 2019-01-07 NOTE — Progress Notes (Signed)
Assessment & Plan:  Charles Schultz was seen today for sore throat.  Diagnoses and all orders for this visit:  Sialoadenitis -     ibuprofen (ADVIL,MOTRIN) 800 MG tablet; Take 1 tablet (800 mg total) by mouth every 8 (eight) hours as needed.  Lymphadenopathy Needs to be referred to ENT and have an US performed.  Patient has been advised to apply for financial assistance and schedule to see our financial counselor.   Patient has been counseled on age-appropriate routine health concerns for screening and prevention. These are reviewed and up-to-date. Referrals have been placed accordingly. Immunizations are up-to-date or declined.    Subjective:   Chief Complaint  Patient presents with  . Sore Throat   HPI Charles Schultz 38 y.o. male presents to office today with recurrent complaints of a swollen left submandibular gland. 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 He states he lost his health insurance last year and has never been evaluated by an ENT specialist. He was recently treated in the ED last month for sialoadenitis with amoxicillin and naproxen. At that time he stated he was instructed by me to be treated in the ED. However I did not instruct him to be seen there and there is no record of him calling the office regarding this. He currently denies any fever, dysphagia, pain or tenderness. He has not tried any medications over the counter for his symptoms. Based on physical exam and as he does not endorse pain, fever, dysphagia or difficulty chewing I will not prescribe an antibiotic today as I do not feel it is warranted. I will prescribe an NSAID and have instructed him to call the office if his symptoms persist after 7 days. He has been instructed to make sure he obtains the financial assistance from from the office as it is imperative that he be evaluated by an ENT. US of the head and neck  01-22-2018 IMPRESSION: Ultrasound survey in the region of the left submandibular  gland suggests sialoadenitis and reactive lymphadenopathy.    Review of Systems  Constitutional: Negative for fever, malaise/fatigue and weight loss.  HENT: Negative.  Negative for congestion, ear pain, hearing loss, nosebleeds, sinus pain, sore throat and tinnitus.        SEE HPI  Eyes: Negative.  Negative for blurred vision, double vision and photophobia.  Respiratory: Negative.  Negative for cough and shortness of breath.   Cardiovascular: Negative.  Negative for chest pain, palpitations and leg swelling.  Gastrointestinal: Negative.  Negative for heartburn, nausea and vomiting.  Musculoskeletal: Negative.  Negative for myalgias.  Neurological: Negative.  Negative for dizziness, focal weakness, seizures and headaches.  Psychiatric/Behavioral: Negative.  Negative for suicidal ideas.    Past Medical History:  Diagnosis Date  . Sialadenitis     No past surgical history on file.  Family History  Problem Relation Age of Onset  . Diabetes Father     Social History Reviewed with no changes to be made today.   Outpatient Medications Prior to Visit  Medication Sig Dispense Refill  . amoxicillin (AMOXIL) 500 MG capsule Take 1 capsule (500 mg total) by mouth 3 (three) times daily. (Patient not taking: Reported on 01/07/2019) 30 capsule 0  . naproxen (NAPROSYN) 500 MG tablet Take 1 tablet (500 mg total) by mouth 2 (two) times daily. (Patient not taking: Reported on 01/07/2019) 30 tablet 0   No facility-administered medications prior to visit.     No Known Allergies  Objective:    BP 106/69   Pulse (!) 57   Temp 97.7 F (36.5 C) (Oral)   Ht 5\' 7"  (1.702 m)   Wt 138 lb 6.4 oz (62.8 kg)   SpO2 97%   BMI 21.68 kg/m  Wt Readings from Last 3 Encounters:  01/07/19 138 lb 6.4 oz (62.8 kg)  09/09/18 148 lb (67.1 kg)  05/27/18 124 lb 3.2 oz (56.3 kg)    Physical Exam Vitals signs and nursing note reviewed.  Constitutional:      Appearance: He is well-developed.  HENT:       Head: Normocephalic and atraumatic.     Mouth/Throat:     Mouth: Mucous membranes are moist.     Dentition: Abnormal dentition. Does not have dentures. No dental tenderness, gingival swelling, dental abscesses or gum lesions.     Tongue: No lesions.     Palate: No mass and lesions.     Pharynx: Oropharynx is clear.  Neck:     Musculoskeletal: Normal range of motion.     Thyroid: No thyroid mass, thyromegaly or thyroid tenderness.   Cardiovascular:     Rate and Rhythm: Normal rate and regular rhythm.     Heart sounds: Normal heart sounds. No murmur. No friction rub. No gallop.   Pulmonary:     Effort: Pulmonary effort is normal. No tachypnea or respiratory distress.     Breath sounds: Normal breath sounds. No decreased breath sounds, wheezing, rhonchi or rales.  Chest:     Chest wall: No tenderness.  Abdominal:     General: Bowel sounds are normal.     Palpations: Abdomen is soft.  Musculoskeletal: Normal range of motion.  Lymphadenopathy:     Head:     Left side of head: Submandibular adenopathy present.  Skin:    General: Skin is warm and dry.  Neurological:     Mental Status: He is alert and oriented to person, place, and time.     Coordination: Coordination normal.  Psychiatric:        Behavior: Behavior normal. Behavior is cooperative.        Thought Content: Thought content normal.        Judgment: Judgment normal.          Patient has been counseled extensively about nutrition and exercise as well as the importance of adherence with medications and regular follow-up. The patient was given clear instructions to go to ER or return to medical center if symptoms don't improve, worsen or new problems develop. The patient verbalized understanding.   Follow-up: Return for call if symptoms not improved in 7 days; f/u with me in 6-8 weeks for physical. Give pt. Fin. forms.   Claiborne Rigg, FNP-BC [facility] [place],  [place] [phone]   01/09/2019, 12:55 PM

## 2019-01-07 NOTE — Patient Instructions (Signed)
Lymphadenopathy    Lymphadenopathy means that your lymph glands are swollen or larger than normal (enlarged). Lymph glands, also called lymph nodes, are collections of tissue that filter bacteria, viruses, and waste from your bloodstream. They are part of your body's disease-fighting system (immune system), which protects your body from germs.  There may be different causes of lymphadenopathy, depending on where it is in your body. Some types go away on their own. Lymphadenopathy can occur anywhere that you have lymph glands, including these areas:  · Neck (cervical lymphadenopathy).  · Chest (mediastinal lymphadenopathy).  · Lungs (hilar lymphadenopathy).  · Underarms (axillary lymphadenopathy).  · Groin (inguinal lymphadenopathy).  When your immune system responds to germs, infection-fighting cells and fluid build up in your lymph glands. This causes some swelling and enlargement. If the lymph glands do not go back to normal after you have an infection or disease, your health care provider may do tests. These tests help to monitor your condition and find the reason why the glands are still swollen and enlarged.  Follow these instructions at home:  · Get plenty of rest.  · Take over-the-counter and prescription medicines only as told by your health care provider. Your health care provider may recommend over-the-counter medicines for pain.  · If directed, apply heat to swollen lymph glands as often as told by your health care provider. Use the heat source that your health care provider recommends, such as a moist heat pack or a heating pad.  ? Place a towel between your skin and the heat source.  ? Leave the heat on for 20-30 minutes.  ? Remove the heat if your skin turns bright red. This is especially important if you are unable to feel pain, heat, or cold. You may have a greater risk of getting burned.  · Check your affected lymph glands every day for changes. Check other lymph gland areas as told by your health  care provider. Check for changes such as:  ? More swelling.  ? Sudden increase in size.  ? Redness or pain.  ? Hardness.  · Keep all follow-up visits as told by your health care provider. This is important.  Contact a health care provider if you have:  · Swelling that gets worse or spreads to other areas.  · Problems with breathing.  · Lymph glands that:  ? Are still swollen after 2 weeks.  ? Have suddenly gotten bigger.  ? Are red, painful, or hard.  · A fever or chills.  · Fatigue.  · A sore throat.  · Pain in your abdomen.  · Weight loss.  · Night sweats.  Get help right away if you have:  · Fluid leaking from an enlarged lymph gland.  · Severe pain.  · Chest pain.  · Shortness of breath.  Summary  · Lymphadenopathy means that your lymph glands are swollen or larger than normal (enlarged).  · Lymph glands (also called lymph nodes) are collections of tissue that filter bacteria, viruses, and waste from the bloodstream. They are part of your body's disease-fighting system (immune system).  · Lymphadenopathy can occur anywhere that you have lymph glands.  · If your enlarged and swollen lymph glands do not go back to normal after you have an infection or disease, your health care provider may do tests to monitor your condition and find the reason why the glands are still swollen and enlarged.  · Check your affected lymph glands every day for changes. Check other lymph   gland areas as told by your health care provider.  This information is not intended to replace advice given to you by your health care provider. Make sure you discuss any questions you have with your health care provider.  Document Released: 09/23/2008 Document Revised: 10/30/2017 Document Reviewed: 10/30/2017  Elsevier Interactive Patient Education © 2019 Elsevier Inc.

## 2019-01-09 ENCOUNTER — Encounter: Payer: Self-pay | Admitting: Nurse Practitioner

## 2019-03-04 ENCOUNTER — Ambulatory Visit: Payer: Self-pay | Attending: Nurse Practitioner | Admitting: Nurse Practitioner

## 2019-03-04 ENCOUNTER — Encounter: Payer: Self-pay | Admitting: Nurse Practitioner

## 2019-03-04 VITALS — BP 113/66 | HR 62 | Temp 98.3°F | Ht 67.0 in | Wt 142.6 lb

## 2019-03-04 DIAGNOSIS — Z0000 Encounter for general adult medical examination without abnormal findings: Secondary | ICD-10-CM

## 2019-03-04 DIAGNOSIS — K1120 Sialoadenitis, unspecified: Secondary | ICD-10-CM

## 2019-03-04 NOTE — Progress Notes (Signed)
Assessment & Plan:  [name] was seen today for annual exam.  Diagnoses and all orders for this visit:  Encounter for routine history and physical exam for male -     CBC -     CMP14+EGFR -     Lipid panel  Sialoadenitis of submandibular gland -     Ambulatory referral to ENT    Patient has been counseled on age-appropriate routine health concerns for screening and prevention. These are reviewed and up-to-date. Referrals have been placed accordingly. Immunizations are up-to-date or declined.    Subjective:   Chief Complaint  Patient presents with  . Annual Exam    Patient is here for a physical.    HPI Charles Schultz 38 y.o. male presents to office today for annual physical. 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 They attempted to call him however the number was out of service. He has given [place] his new phone number. Will refer again to ENT. Today on exam gland has significantly decreased in size and is non tender to palpation. He states he has cut back significantly on tobacco intake as well as marijuana.      Review of Systems  Constitutional: Negative for fever, malaise/fatigue and weight loss.  HENT: Negative.  Negative for nosebleeds.   Eyes: Negative.  Negative for blurred vision, double vision and photophobia.  Respiratory: Negative.  Negative for cough and shortness of breath.   Cardiovascular: Negative.  Negative for chest pain, palpitations and leg swelling.  Gastrointestinal: Negative.  Negative for heartburn, nausea and vomiting.  Genitourinary: Negative.   Musculoskeletal: Negative.  Negative for myalgias.  Skin: Negative.   Neurological: Negative.  Negative for dizziness, focal weakness, seizures and headaches.  Endo/Heme/Allergies: Negative.   Psychiatric/Behavioral: Negative.  Negative for suicidal ideas.    Past Medical History:  Diagnosis Date  . Sialadenitis     History reviewed. No pertinent surgical  history.  Family History  Problem Relation Age of Onset  . Diabetes Father     Social History Reviewed with no changes to be made today.   Outpatient Medications Prior to Visit  Medication Sig Dispense Refill  . ibuprofen (ADVIL,MOTRIN) 800 MG tablet Take 1 tablet (800 mg total) by mouth every 8 (eight) hours as needed. 60 tablet 0  . amoxicillin (AMOXIL) 500 MG capsule Take 1 capsule (500 mg total) by mouth 3 (three) times daily. (Patient not taking: Reported on 01/07/2019) 30 capsule 0  . naproxen (NAPROSYN) 500 MG tablet Take 1 tablet (500 mg total) by mouth 2 (two) times daily. (Patient not taking: Reported on 01/07/2019) 30 tablet 0   No facility-administered medications prior to visit.     No Known Allergies     Objective:    BP 113/66 (BP Location: Right Arm, Patient Position: Sitting, Cuff Size: Normal)   Pulse 62   Temp 98.3 F (36.8 C) (Oral)   Ht 5' 7"  (1.702 m)   Wt 142 lb 9.6 oz (64.7 kg)   SpO2 97%   BMI 22.33 kg/m  Wt Readings from Last 3 Encounters:  03/04/19 142 lb 9.6 oz (64.7 kg)  01/07/19 138 lb 6.4 oz (62.8 kg)  09/09/18 148 lb (67.1 kg)    Physical Exam Constitutional:      Appearance: He is well-developed.  HENT:     Head: Normocephalic and atraumatic.     Jaw: There is normal jaw occlusion.     Salivary Glands: Left salivary gland  is diffusely enlarged. Left salivary gland is not tender.     Right Ear: Hearing, tympanic membrane, ear canal and external ear normal.     Left Ear: Hearing, tympanic membrane, ear canal and external ear normal.     Nose: Nose normal. No mucosal edema or rhinorrhea.     Mouth/Throat:     Pharynx: Uvula midline.     Tonsils: No tonsillar exudate. Swelling: 1+ on the right. 1+ on the left.  Eyes:     General: Lids are normal. No scleral icterus.    Extraocular Movements: Extraocular movements intact.     Conjunctiva/sclera: Conjunctivae normal.     Pupils: Pupils are equal, round, and reactive to light.  Neck:      Musculoskeletal: Full passive range of motion without pain, normal range of motion and neck supple.     Thyroid: No thyroid mass or thyromegaly.     Trachea: Trachea normal. No tracheal deviation.   Cardiovascular:     Rate and Rhythm: Normal rate and regular rhythm.     Pulses:          Posterior tibial pulses are 2+ on the right side and 2+ on the left side.     Heart sounds: Normal heart sounds. No murmur. No friction rub. No gallop.   Pulmonary:     Effort: Pulmonary effort is normal. No respiratory distress.     Breath sounds: Normal breath sounds. No wheezing or rales.  Chest:     Chest wall: No mass or tenderness.     Breasts:        Right: No inverted nipple, mass, nipple discharge, skin change or tenderness.        Left: No inverted nipple, mass, nipple discharge, skin change or tenderness.  Abdominal:     General: Abdomen is flat. Bowel sounds are normal. There is no distension.     Palpations: Abdomen is soft. There is no mass.     Tenderness: There is no abdominal tenderness. There is no guarding or rebound.     Hernia: There is no hernia in the right inguinal area or left inguinal area.  Genitourinary:    Pubic Area: No rash.      Penis: Normal and circumcised.      Scrotum/Testes: Normal.        Right: Mass, tenderness or swelling not present. Right testis is descended. Cremasteric reflex is present.         Left: Mass, tenderness or swelling not present. Left testis is descended. Cremasteric reflex is present.   Musculoskeletal: Normal range of motion.        General: No tenderness or deformity.  Feet:     Right foot:     Protective Sensation: 10 sites tested. 10 sites sensed.     Skin integrity: Dry skin present. No skin breakdown or callus.     Left foot:     Protective Sensation: 10 sites tested. 10 sites sensed.     Skin integrity: Dry skin present. No skin breakdown or callus.  Lymphadenopathy:     Cervical: No cervical adenopathy.     Lower Body: No right  inguinal adenopathy. No left inguinal adenopathy.  Skin:    General: Skin is warm and dry.     Capillary Refill: Capillary refill takes less than 2 seconds.     Findings: No erythema.  Neurological:     Mental Status: He is alert and oriented to person, place, and time.  Cranial Nerves: No cranial nerve deficit.     Motor: Motor function is intact. No abnormal muscle tone.     Coordination: Coordination is intact. Coordination normal.     Gait: Gait is intact.     Deep Tendon Reflexes: Reflexes normal.  Psychiatric:        Behavior: Behavior normal.        Thought Content: Thought content normal.        Judgment: Judgment normal.          Patient has been counseled extensively about nutrition and exercise as well as the importance of adherence with medications and regular follow-up. The patient was given clear instructions to go to ER or return to medical center if symptoms don't improve, worsen or new problems develop. The patient verbalized understanding.   Follow-up: No follow-ups on file.   Gildardo Pounds, FNP-BC [facility] [place], [place]   03/04/2019, 2:28 PM

## 2019-03-04 NOTE — Patient Instructions (Signed)
Salivary Gland Infection  A salivary gland infection is an infection in one or more of the glands that produce saliva. You have six major salivary glands. Each gland has a duct that carries saliva into your mouth. Saliva keeps your mouth moist and breaks down the food that you eat. It also helps prevent tooth decay. Two salivary glands are located just in front of your ears (parotid). The ducts for these glands open up inside your cheeks, near your back teeth. You also have two glands under your tongue (sublingual) and two glands under your jaw (submandibular). The ducts for these glands open under your tongue. Any salivary gland can become infected. Most infections occur in the parotid glands or submandibular glands. What are the causes? This condition may be caused by bacteria or viruses.  The bacteria that cause salivary gland infections are usually the same bacteria that normally live in your mouth. ? A stone can form in a salivary gland and block the flow of saliva. As a result, saliva backs up into the salivary gland. Bacteria may then start to grow behind the blockage and cause infection. ? Bacterial infections usually cause pain and swelling on one side of the face. Submandibular gland swelling occurs under the jaw. Parotid swelling occurs in front of the ear. ? Bacterial infections are more common in adults.  The mumps virus is the most common cause of viral salivary gland infections. However, mumps is now rare because of vaccination. ? This infection causes swelling in both parotid glands. ? Viral infections are more common in children. What increases the risk? The following factors may make you more likely to develop a bacterial infection:  Not taking good care of your mouth and teeth (poor oral hygiene).  Smoking.  Not drinking enough water.  Having a disease that causes dry mouth and dry eyes (Mikulicz syndrome or Sjogren syndrome). A viral infection is more likely to occur in  children who do not get the MMR (measles, mumps, rubella) vaccine. What are the signs or symptoms? The main sign of a salivary gland infection is a swollen salivary gland. This type of inflammation is often called sialadenitis. You may have swelling in front of your ear, under your jaw, or under your tongue. Swelling may get worse when you eat and decrease after you eat. Other signs and symptoms include:  Pain.  Tenderness.  Redness.  Dry mouth.  Bad taste in your mouth.  Difficulty chewing and swallowing.  Fever. How is this diagnosed? This condition may be diagnosed based on:  Your signs and symptoms.  A physical exam. During the exam, your health care provider will look and feel inside your mouth to see whether a stone is blocking a salivary gland duct.  Tests, such as: ? An X-ray to check for a stone. ? An ultrasound, CT scan, or MRI to look for an abscess and to rule out other causes of swelling. ? A culture and sensitivity test. In this test, a sample of pus is taken from the salivary gland with a swab or by using a needle (aspiration). The sample is tested in a lab to determine the type of bacteria that is growing and which antibiotic medicines will work against it. You may need to see an ear, nose, and throat specialist (ENT or otolaryngologist) for diagnosis and treatment. How is this treated? Viral salivary gland infections usually clear up without treatment. Bacterial infections are usually treated with antibiotic medicine. Severe infections that cause difficulty with swallowing   may be treated with an IV antibiotic in the hospital. Other treatments may include:  Probing and widening the salivary duct to allow a stone to pass. In some cases, a thin, flexible scope (endoscope) may be inserted into the duct to find a stone and remove it.  Breaking up a stone using sound waves.  Draining an infected gland (abscess) with a needle.  Surgery to: ? Remove a stone. ? Drain  pus from an abscess. ? Remove a badly infected gland. Follow these instructions at home:  Medicines  Take over-the-counter and prescription medicines only as told by your health care provider.  If you were prescribed an antibiotic medicine, take it as told by your health care provider. Do not stop taking the antibiotic even if you start to feel better. To relieve discomfort  Follow these instructions every few hours: ? Suck on a lemon candy to stimulate the flow of saliva. ? Put a warm compress over the gland. ? Gently massage the gland.  Rinse your mouth with a salt-water mixture 3-4 times a day or as needed. To make a salt-water mixture, completely dissolve -1 tsp of salt in 1 cup of warm water. General instructions  Practice good oral hygiene by brushing and flossing your teeth after meals and before you go to bed.  Drink enough fluid to keep your urine pale yellow.  Do not use any products that contain nicotine or tobacco, such as cigarettes and e-cigarettes. If you need help quitting, ask your health care provider.  Keep all follow-up visits as told by your health care provider. This is important. Contact a health care provider if:  You have pain and swelling in your face, jaw, or mouth after eating.  You have persistent swelling in any of these places: ? In front of your ear. ? Under your jaw. ? Inside your mouth. Get help right away if:  You have pain and swelling in your face, jaw, or mouth, and this is getting worse.  Your pain and swelling make it hard to swallow or breathe. Summary  A salivary gland infection is an infection in one or more of the glands that produce saliva. You have six major salivary glands. Each gland has a duct that carries saliva into your mouth.  Any salivary gland can become infected. Most infections occur in the glands just in front of your ears (parotid glands) or the glands under your jaw (submandibular glands).  This condition may be  caused by bacteria or viruses.  Salivary gland infections caused by a virus usually clear up without treatment. Bacterial infections are usually treated with antibiotic medicine. This information is not intended to replace advice given to you by your health care provider. Make sure you discuss any questions you have with your health care provider. Document Released: 01/22/2005 Document Revised: 01/13/2018 Document Reviewed: 01/13/2018 Elsevier Interactive Patient Education  2019 Elsevier Inc.  

## 2019-03-05 LAB — LIPID PANEL
CHOL/HDL RATIO: 3.4 ratio (ref 0.0–5.0)
Cholesterol, Total: 195 mg/dL (ref 100–199)
HDL: 58 mg/dL (ref >39)
LDL CALC: 122 mg/dL — AB (ref 0–99)
Triglycerides: 77 mg/dL (ref 0–149)
VLDL Cholesterol Cal: 15 mg/dL (ref 5–40)

## 2019-03-05 LAB — CBC
HEMATOCRIT: 41.1 % (ref 37.5–51.0)
HEMOGLOBIN: 13.9 g/dL (ref 13.0–17.7)
MCH: 31.6 pg (ref 26.6–33.0)
MCHC: 33.8 g/dL (ref 31.5–35.7)
MCV: 93 fL (ref 79–97)
Platelets: 235 10*3/uL (ref 150–450)
RBC: 4.4 x10E6/uL (ref 4.14–5.80)
RDW: 12.6 % (ref 11.6–15.4)
WBC: 5.4 10*3/uL (ref 3.4–10.8)

## 2019-03-05 LAB — CMP14+EGFR
ALBUMIN: 4.6 g/dL (ref 4.0–5.0)
ALK PHOS: 38 IU/L — AB (ref 39–117)
ALT: 8 IU/L (ref 0–44)
AST: 16 IU/L (ref 0–40)
Albumin/Globulin Ratio: 1.7 (ref 1.2–2.2)
BUN / CREAT RATIO: 9 (ref 9–20)
BUN: 11 mg/dL (ref 6–20)
Bilirubin Total: 0.4 mg/dL (ref 0.0–1.2)
CALCIUM: 9.5 mg/dL (ref 8.7–10.2)
CO2: 24 mmol/L (ref 20–29)
CREATININE: 1.27 mg/dL (ref 0.76–1.27)
Chloride: 104 mmol/L (ref 96–106)
GFR, EST AFRICAN AMERICAN: 82 mL/min/{1.73_m2} (ref >59)
GFR, EST NON AFRICAN AMERICAN: 71 mL/min/{1.73_m2} (ref >59)
GLOBULIN, TOTAL: 2.7 g/dL (ref 1.5–4.5)
Glucose: 74 mg/dL (ref 65–99)
Potassium: 4.4 mmol/L (ref 3.5–5.2)
SODIUM: 139 mmol/L (ref 134–144)
TOTAL PROTEIN: 7.3 g/dL (ref 6.0–8.5)

## 2019-03-07 ENCOUNTER — Telehealth: Payer: Self-pay

## 2019-03-07 NOTE — Telephone Encounter (Signed)
CMA spoke to patient. Patient verified DOB.  Patient was informed on lab results.  Pt. Understood.

## 2019-03-07 NOTE — Telephone Encounter (Signed)
-----   Message from Claiborne Rigg, NP sent at 03/06/2019  9:16 PM EDT ----- Labs do not show anemia and kidney, liver function are normal along with cholesterol.

## 2019-10-03 ENCOUNTER — Encounter (HOSPITAL_COMMUNITY): Payer: Self-pay

## 2019-10-03 ENCOUNTER — Other Ambulatory Visit: Payer: Self-pay

## 2019-10-03 ENCOUNTER — Ambulatory Visit (HOSPITAL_COMMUNITY)
Admission: EM | Admit: 2019-10-03 | Discharge: 2019-10-03 | Disposition: A | Discharge status: Home / Self Care | Payer: Self-pay | Attending: Family Medicine | Admitting: Family Medicine

## 2019-10-03 DIAGNOSIS — K1120 Sialoadenitis, unspecified: Secondary | ICD-10-CM

## 2019-10-03 MED ORDER — AMOXICILLIN 875 MG PO TABS: 875.0000 mg | ORAL_TABLET | Freq: Two times a day (BID) | ORAL | 0 refills | Status: DC

## 2019-10-03 MED ORDER — IBUPROFEN 800 MG PO TABS: 800.0000 mg | ORAL_TABLET | Freq: Three times a day (TID) | ORAL | 0 refills | Status: DC | PRN

## 2019-10-03 NOTE — ED Provider Notes (Signed)
[place]    CSN: [id number] Arrival date & time: 10/03/19  1234      History   Chief Complaint Chief Complaint  Patient presents with  . Swollen glands    HPI Charles Schultz is a 38 y.o. male.   HPI  Patient has problems with recurring submandibular gland infections.  He has been here many times for antibiotics.  He finally has an appointment with ENT for Friday.  He has a flareup that started over a week ago.  Is becoming more swollen and painful.  He was trying to wait until his ENT appointment, however, he called his PCP and she sent for him to come to the urgent care center for treatment.  He does well with amoxicillin.  Ibuprofen for pain.  Past Medical History:  Diagnosis Date  . Sialadenitis     Patient Active Problem List   Diagnosis Date Noted  . Sialoadenitis 02/24/2018    History reviewed. No pertinent surgical history.     Home Medications    Prior to Admission medications   Medication Sig Start Date End Date Taking? Authorizing Provider  amoxicillin (AMOXIL) 875 MG tablet Take 1 tablet (875 mg total) by mouth 2 (two) times daily. 10/03/19   Raylene Everts, MD  ibuprofen (ADVIL) 800 MG tablet Take 1 tablet (800 mg total) by mouth every 8 (eight) hours as needed. 10/03/19   Raylene Everts, MD  naproxen (NAPROSYN) 500 MG tablet Take 1 tablet (500 mg total) by mouth 2 (two) times daily. Patient not taking: Reported on 01/07/2019 09/09/18   Ashley Murrain, NP    Family History Family History  Problem Relation Age of Onset  . Diabetes Father     Social History Social History   Tobacco Use  . Smoking status: Never Smoker  . Smokeless tobacco: Never Used  . Tobacco comment: still smoking Marijuana   Substance Use Topics  . Alcohol use: Yes    Comment: occasionally   . Drug use: Yes    Types: Marijuana     Allergies   Patient has no known allergies.   Review of Systems Review of Systems  Constitutional: Negative for chills  and fever.  HENT: Negative for ear pain and sore throat.        Swollen gland  Eyes: Negative for pain and visual disturbance.  Respiratory: Negative for cough and shortness of breath.   Cardiovascular: Negative for chest pain and palpitations.  Gastrointestinal: Negative for abdominal pain and vomiting.  Genitourinary: Negative for dysuria and hematuria.  Musculoskeletal: Negative for arthralgias and back pain.  Skin: Negative for color change and rash.  Neurological: Negative for seizures and syncope.  All other systems reviewed and are negative.    Physical Exam Triage Vital Signs ED Triage Vitals  Enc Vitals Group     BP 10/03/19 1253 126/77     Pulse Rate 10/03/19 1253 68     Resp 10/03/19 1253 18     Temp 10/03/19 1253 98.2 F (36.8 C)     Temp Source 10/03/19 1253 Oral     SpO2 10/03/19 1253 100 %     Weight --      Height --      Head Circumference --      Peak Flow --      Pain Score 10/03/19 1251 7     Pain Loc --      Pain Edu? --      Excl. in  GC? --    No data found.  Updated Vital Signs BP 126/77 (BP Location: Left Arm)   Pulse 68   Temp 98.2 F (36.8 C) (Oral)   Resp 18   SpO2 100%   Visual Acuity Right Eye Distance:   Left Eye Distance:   Bilateral Distance:    Right Eye Near:   Left Eye Near:    Bilateral Near:     Physical Exam Constitutional:      General: He is not in acute distress.    Appearance: He is well-developed.  HENT:     Head: Normocephalic and atraumatic.     Right Ear: Tympanic membrane and ear canal normal.     Left Ear: Tympanic membrane and ear canal normal.     Nose: Nose normal.     Mouth/Throat:     Mouth: Mucous membranes are moist.  Eyes:     Conjunctiva/sclera: Conjunctivae normal.     Pupils: Pupils are equal, round, and reactive to light.  Neck:     Musculoskeletal: Normal range of motion.   Cardiovascular:     Rate and Rhythm: Normal rate.  Pulmonary:     Effort: Pulmonary effort is normal. No  respiratory distress.  Abdominal:     General: There is no distension.     Palpations: Abdomen is soft.  Musculoskeletal: Normal range of motion.  Skin:    General: Skin is warm and dry.  Neurological:     Mental Status: He is alert.      UC Treatments / Results  Labs (all labs ordered are listed, but only abnormal results are displayed) Labs Reviewed - No data to display  EKG   Radiology No results found.  Procedures Procedures (including critical care time)  Medications Ordered in UC Medications - No data to display  Initial Impression / Assessment and Plan / UC Course  I have reviewed the triage vital signs and the nursing notes.  Pertinent labs & imaging results that were available during my care of the patient were reviewed by me and considered in my medical decision making (see chart for details).      Final Clinical Impressions(s) / UC Diagnoses   Final diagnoses:  Sialoadenitis     Discharge Instructions     Try warm compresses to area Take antibiotic 2 times a day Take ibuprofen as needed for pain Follow-up with ENT   ED Prescriptions    Medication Sig Dispense Auth. Provider   ibuprofen (ADVIL) 800 MG tablet Take 1 tablet (800 mg total) by mouth every 8 (eight) hours as needed. 60 tablet Eustace Moore, MD   amoxicillin (AMOXIL) 875 MG tablet Take 1 tablet (875 mg total) by mouth 2 (two) times daily. 14 tablet Eustace Moore, MD     PDMP not reviewed this encounter.   Eustace Moore, MD 10/03/19 [phone]

## 2019-10-03 NOTE — ED Triage Notes (Signed)
Pt present swollen glands on the left side. Symptoms started over a week ago.

## 2019-10-03 NOTE — Discharge Instructions (Signed)
Try warm compresses to area Take antibiotic 2 times a day Take ibuprofen as needed for pain Follow-up with ENT

## 2019-10-05 ENCOUNTER — Ambulatory Visit: Payer: Self-pay

## 2019-10-10 ENCOUNTER — Ambulatory Visit: Payer: Self-pay

## 2019-10-10 ENCOUNTER — Ambulatory Visit (INDEPENDENT_AMBULATORY_CARE_PROVIDER_SITE_OTHER): Payer: Self-pay | Admitting: Family Medicine

## 2019-10-10 ENCOUNTER — Encounter: Payer: Self-pay | Admitting: Family Medicine

## 2019-10-10 ENCOUNTER — Other Ambulatory Visit: Payer: Self-pay

## 2019-10-10 VITALS — BP 122/67 | HR 62 | Temp 97.3°F | Resp 17 | Wt 148.0 lb

## 2019-10-10 DIAGNOSIS — Z09 Encounter for follow-up examination after completed treatment for conditions other than malignant neoplasm: Secondary | ICD-10-CM

## 2019-10-10 DIAGNOSIS — K1120 Sialoadenitis, unspecified: Secondary | ICD-10-CM

## 2019-10-10 DIAGNOSIS — Z23 Encounter for immunization: Secondary | ICD-10-CM

## 2019-10-10 MED ORDER — CLINDAMYCIN HCL 300 MG PO CAPS: 300.0000 mg | ORAL_CAPSULE | Freq: Two times a day (BID) | ORAL | 1 refills | Status: DC

## 2019-10-10 NOTE — Progress Notes (Signed)
Here for referral to ENT due to blocked mucus glands. Finished the Amoxicillin Rx & states that it helped some but didn't fully resolve

## 2019-10-10 NOTE — Patient Instructions (Signed)
Salivary Gland Infection  A salivary gland infection is an infection in one or more of the glands that produce saliva. You have six major salivary glands. Each gland has a duct that carries saliva into your mouth. Saliva keeps your mouth moist and breaks down the food that you eat. It also helps prevent tooth decay. Two salivary glands are located just in front of your ears (parotid). The ducts for these glands open up inside your cheeks, near your back teeth. You also have two glands under your tongue (sublingual) and two glands under your jaw (submandibular). The ducts for these glands open under your tongue. Any salivary gland can become infected. Most infections occur in the parotid glands or submandibular glands. What are the causes? This condition may be caused by bacteria or viruses.  The bacteria that cause salivary gland infections are usually the same bacteria that normally live in your mouth. ? A stone can form in a salivary gland and block the flow of saliva. As a result, saliva backs up into the salivary gland. Bacteria may then start to grow behind the blockage and cause infection. ? Bacterial infections usually cause pain and swelling on one side of the face. Submandibular gland swelling occurs under the jaw. Parotid swelling occurs in front of the ear. ? Bacterial infections are more common in adults.  The mumps virus is the most common cause of viral salivary gland infections. However, mumps is now rare because of vaccination. ? This infection causes swelling in both parotid glands. ? Viral infections are more common in children. What increases the risk? The following factors may make you more likely to develop a bacterial infection:  Not taking good care of your mouth and teeth (poor oral hygiene).  Smoking.  Not drinking enough water.  Having a disease that causes dry mouth and dry eyes (Mikulicz syndrome or Sjogren syndrome). A viral infection is more likely to occur in  children who do not get the MMR (measles, mumps, rubella) vaccine. What are the signs or symptoms? The main sign of a salivary gland infection is a swollen salivary gland. This type of inflammation is often called sialadenitis. You may have swelling in front of your ear, under your jaw, or under your tongue. Swelling may get worse when you eat and decrease after you eat. Other signs and symptoms include:  Pain.  Tenderness.  Redness.  Dry mouth.  Bad taste in your mouth.  Difficulty chewing and swallowing.  Fever. How is this diagnosed? This condition may be diagnosed based on:  Your signs and symptoms.  A physical exam. During the exam, your health care provider will look and feel inside your mouth to see whether a stone is blocking a salivary gland duct.  Tests, such as: ? An X-ray to check for a stone. ? An ultrasound, CT scan, or MRI to look for an abscess and to rule out other causes of swelling. ? A culture and sensitivity test. In this test, a sample of pus is taken from the salivary gland with a swab or by using a needle (aspiration). The sample is tested in a lab to determine the type of bacteria that is growing and which antibiotic medicines will work against it. You may need to see an ear, nose, and throat specialist (ENT or otolaryngologist) for diagnosis and treatment. How is this treated? Viral salivary gland infections usually clear up without treatment. Bacterial infections are usually treated with antibiotic medicine. Severe infections that cause difficulty with swallowing   may be treated with an IV antibiotic in the hospital. Other treatments may include:  Probing and widening the salivary duct to allow a stone to pass. In some cases, a thin, flexible scope (endoscope) may be inserted into the duct to find a stone and remove it.  Breaking up a stone using sound waves.  Draining an infected gland (abscess) with a needle.  Surgery to: ? Remove a stone. ? Drain  pus from an abscess. ? Remove a badly infected gland. Follow these instructions at home:  Medicines  Take over-the-counter and prescription medicines only as told by your health care provider.  If you were prescribed an antibiotic medicine, take it as told by your health care provider. Do not stop taking the antibiotic even if you start to feel better. To relieve discomfort  Follow these instructions every few hours: ? Suck on a lemon candy to stimulate the flow of saliva. ? Put a warm compress over the gland. ? Gently massage the gland.  Rinse your mouth with a salt-water mixture 3-4 times a day or as needed. To make a salt-water mixture, completely dissolve -1 tsp of salt in 1 cup of warm water. General instructions  Practice good oral hygiene by brushing and flossing your teeth after meals and before you go to bed.  Drink enough fluid to keep your urine pale yellow.  Do not use any products that contain nicotine or tobacco, such as cigarettes and e-cigarettes. If you need help quitting, ask your health care provider.  Keep all follow-up visits as told by your health care provider. This is important. Contact a health care provider if:  You have pain and swelling in your face, jaw, or mouth after eating.  You have persistent swelling in any of these places: ? In front of your ear. ? Under your jaw. ? Inside your mouth. Get help right away if:  You have pain and swelling in your face, jaw, or mouth, and this is getting worse.  Your pain and swelling make it hard to swallow or breathe. Summary  A salivary gland infection is an infection in one or more of the glands that produce saliva. You have six major salivary glands. Each gland has a duct that carries saliva into your mouth.  Any salivary gland can become infected. Most infections occur in the glands just in front of your ears (parotid glands) or the glands under your jaw (submandibular glands).  This condition may be  caused by bacteria or viruses.  Salivary gland infections caused by a virus usually clear up without treatment. Bacterial infections are usually treated with antibiotic medicine. This information is not intended to replace advice given to you by your health care provider. Make sure you discuss any questions you have with your health care provider. Document Released: 01/22/2005 Document Revised: 01/13/2018 Document Reviewed: 01/13/2018 Elsevier Patient Education  2020 [employer].

## 2019-10-10 NOTE — Progress Notes (Signed)
Established Patient Office Visit  Subjective:  Patient ID: Charles Schultz, male    DOB: 09-Aug-1981  Age: 38 y.o. MRN: [id number]  CC:  Chief Complaint  Patient presents with  . 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  He reports that he has had the recurrent swelling, only on the left side, for years.  He is status post 7-day treatment with amoxicillin 875 mg daily.  He states that he has had reduction in tenderness and swelling but continues to have some swelling.  He states that sometimes the swelling will to be just on the left side beneath the mouth/jaw but other times can extend up towards the left ear.  He has been doing this suggested things like using warm compresses to the area, taking ibuprofen for pain and sucking on hard/sour candies but this usually does not really help to relieve the pain or swelling.  He denies any current issues with fever or chills, he denies any dental issues, no nasal congestion, no sinus pain or pressure, no earache.  He denies any cough or shortness of breath, no chest pain or palpitations, no abdominal pain-no nausea or vomiting.  He reports that other than the recurrent issues with swelling in this gland on the left side of his neck that he has had no other health issues.  He does not smoke cigarettes or cigars but states that he has smoked marijuana off and on since college.  He reports that he needs a referral to ear nose and throat.  He had a referral earlier this year but due to COVID-19, the ENT office closed and he was never rescheduled for follow-up.  Past Medical History:  Diagnosis Date  . Sialadenitis     Past Surgical History:  Procedure Laterality Date  . NO PAST SURGERIES      Family History  Problem Relation Age of Onset  . Diabetes Father   . Prostate cancer Father     Social History   Socioeconomic  History  . Marital status: Married    Spouse name: Not on file  . Number of children: Not on file  . Years of education: Not on file  . Highest education level: Not on file  Occupational History  . Not on file  Social Needs  . Financial resource strain: Not on file  . Food insecurity    Worry: Not on file    Inability: Not on file  . Transportation needs    Medical: Not on file    Non-medical: Not on file  Tobacco Use  . Smoking status: Never Smoker  . Smokeless tobacco: Never Used  . Tobacco comment: still smoking Marijuana   Substance and Sexual Activity  . Alcohol use: Yes    Comment: occasionally   . Drug use: Yes    Types: Marijuana  . Sexual activity: Yes  Lifestyle  . Physical activity    Days per week: Not on file    Minutes per session: Not on file  . Stress: Not on file  Relationships  . Social Herbalist on phone: Not on file    Gets together: Not on file    Attends religious service: Not on file    Active member of club or organization: Not on file    Attends meetings of clubs or organizations: Not on file    Relationship  status: Not on file  . Intimate partner violence    Fear of current or ex partner: Not on file    Emotionally abused: Not on file    Physically abused: Not on file    Forced sexual activity: Not on file  Other Topics Concern  . Not on file  Social History Narrative  . Not on file    Outpatient Medications Prior to Visit  Medication Sig Dispense Refill  . amoxicillin (AMOXIL) 875 MG tablet Take 1 tablet (875 mg total) by mouth 2 (two) times daily. 14 tablet 0  . ibuprofen (ADVIL) 800 MG tablet Take 1 tablet (800 mg total) by mouth every 8 (eight) hours as needed. 60 tablet 0  . naproxen (NAPROSYN) 500 MG tablet Take 1 tablet (500 mg total) by mouth 2 (two) times daily. (Patient not taking: Reported on 01/07/2019) 30 tablet 0   No facility-administered medications prior to visit.     No Known Allergies  ROS Review of  Systems  Constitutional: Negative for chills, fatigue and fever.  HENT: Negative for congestion, ear pain, sinus pressure, sinus pain, sore throat and trouble swallowing.   Eyes: Negative for photophobia and visual disturbance.  Respiratory: Negative for cough and shortness of breath.   Cardiovascular: Negative for chest pain, palpitations and leg swelling.  Gastrointestinal: Negative for abdominal pain, constipation, diarrhea and nausea.  Endocrine: Negative for cold intolerance, heat intolerance, polydipsia, polyphagia and polyuria.  Genitourinary: Negative for dysuria and frequency.  Musculoskeletal: Negative for arthralgias, back pain and gait problem.  Allergic/Immunologic: Negative for environmental allergies, food allergies and immunocompromised state.  Neurological: Negative for dizziness and headaches.  Hematological: Positive for adenopathy. Does not bruise/bleed easily.      Objective:    Physical Exam  Constitutional: He is oriented to person, place, and time. He appears well-developed and well-nourished.  HENT:  Patient with good dentition.  Patient however with area of pronounced erythema under the left side of the tongue.  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  Neck: Normal range of motion. Neck supple. No JVD present. No thyromegaly present.  Cardiovascular: Normal rate and regular rhythm.  Pulmonary/Chest: Effort normal and breath sounds normal.  Lymphadenopathy:    He has cervical adenopathy (Patient with enlarged but currently nontender lymph node left submandibular area).  Neurological: He is alert and oriented to person, place, and time. No cranial nerve deficit.  Skin: Skin is warm and dry.  Psychiatric: He has a normal mood and affect. His behavior is normal.  Nursing note and vitals reviewed.   BP 122/67   Pulse 62    Temp (!) 97.3 F (36.3 C) (Temporal)   Resp 17   Wt 148 lb (67.1 kg)   SpO2 97%   BMI 23.18 kg/m  Wt Readings from Last 3 Encounters:  10/10/19 148 lb (67.1 kg)  03/04/19 142 lb 9.6 oz (64.7 kg)  01/07/19 138 lb 6.4 oz (62.8 kg)     Health Maintenance Due  Topic Date Due  . INFLUENZA VACCINE  07/30/2019    There are no preventive care reminders to display for this patient.  Lab Results  Component Value Date   TSH 0.691 01/13/2018   Lab Results  Component Value Date   WBC 5.4 03/04/2019   HGB 13.9 03/04/2019   HCT 41.1 03/04/2019   MCV 93 03/04/2019  PLT 235 03/04/2019   Lab Results  Component Value Date   NA 139 03/04/2019   K 4.4 03/04/2019   CO2 24 03/04/2019   GLUCOSE 74 03/04/2019   BUN 11 03/04/2019   CREATININE 1.27 03/04/2019   BILITOT 0.4 03/04/2019   ALKPHOS 38 (L) 03/04/2019   AST 16 03/04/2019   ALT 8 03/04/2019   PROT 7.3 03/04/2019   ALBUMIN 4.6 03/04/2019   CALCIUM 9.5 03/04/2019   ANIONGAP 8 03/23/2018   Lab Results  Component Value Date   CHOL 195 03/04/2019   Lab Results  Component Value Date   HDL 58 03/04/2019   Lab Results  Component Value Date   LDLCALC 122 (H) 03/04/2019   Lab Results  Component Value Date   TRIG 77 03/04/2019   Lab Results  Component Value Date   CHOLHDL 3.4 03/04/2019   No results found for: HGBA1C    Assessment & Plan:  1. Sialoadenitis; 2.  Encounter for examination following treatment at hospital Patient's recent emergency department notes from 10/03/2019 reviewed at which time he was prescribed amoxicillin 875 mg and ibuprofen for treatment of enlarged left submandibular lymph node.  Patient encouraged to continue use of ibuprofen as needed as well as warm compresses to the area.  Patient given prescription for clindamycin 300 mg twice daily x10 days while awaiting ENT referral as he continues to have swelling in this area.  Stat referral placed for ENT.  Discussed with patient that he needs to  stop the use of marijuana as this may be contributing to oral changes that may be resulting in his recurrent lymph gland infections/blockages.  Patient will have CBC in follow-up but this may be normal as he has been on amoxicillin.  On review of prior labs, he has had normal CBC and normal complete metabolic panel earlier this year. - Ambulatory referral to ENT - clindamycin (CLEOCIN) 300 MG capsule; Take 1 capsule (300 mg total) by mouth 2 (two) times daily.  Dispense: 20 capsule; Refill: 1 - CBC with Differential  3. Need for immunization against influenza Patient was offered and agreed to have influenza immunization at today's visit.  Patient also given educational handout on influenza immunization and possible side effects.   Meds ordered this encounter  Medications  . clindamycin (CLEOCIN) 300 MG capsule    Sig: Take 1 capsule (300 mg total) by mouth 2 (two) times daily.    Dispense:  20 capsule    Refill:  1   An After Visit Summary was printed and given to the patient.  Follow-up: Return in about 3 weeks (around 10/31/2019).    Cain Saupe, MD

## 2019-10-11 LAB — CBC WITH DIFFERENTIAL/PLATELET
Basophils Absolute: 0.1 x10E3/uL (ref 0.0–0.2)
Basos: 2 %
EOS (ABSOLUTE): 0.3 x10E3/uL (ref 0.0–0.4)
Eos: 5 %
Hematocrit: 40.2 % (ref 37.5–51.0)
Hemoglobin: 13.1 g/dL (ref 13.0–17.7)
Immature Grans (Abs): 0 x10E3/uL (ref 0.0–0.1)
Immature Granulocytes: 0 %
Lymphocytes Absolute: 2 x10E3/uL (ref 0.7–3.1)
Lymphs: 40 %
MCH: 31.1 pg (ref 26.6–33.0)
MCHC: 32.6 g/dL (ref 31.5–35.7)
MCV: 96 fL (ref 79–97)
Monocytes Absolute: 0.7 x10E3/uL (ref 0.1–0.9)
Monocytes: 15 %
Neutrophils Absolute: 1.9 x10E3/uL (ref 1.4–7.0)
Neutrophils: 38 %
Platelets: 250 x10E3/uL (ref 150–450)
RBC: 4.21 x10E6/uL (ref 4.14–5.80)
RDW: 12.8 % (ref 11.6–15.4)
WBC: 4.9 x10E3/uL (ref 3.4–10.8)

## 2019-10-12 NOTE — Progress Notes (Signed)
Normal lab letter mailed.

## 2019-11-02 ENCOUNTER — Telehealth: Payer: Self-pay | Admitting: Nurse Practitioner

## 2019-11-02 ENCOUNTER — Ambulatory Visit: Payer: Self-pay

## 2019-11-02 ENCOUNTER — Other Ambulatory Visit: Payer: Self-pay

## 2019-11-02 NOTE — Telephone Encounter (Signed)
Pt states that he now has insurance, its called "[employer]", he still does not have an insurance card but can be referred with it. Please follow up with his otolaryngology appt

## 2019-11-09 ENCOUNTER — Ambulatory Visit (INDEPENDENT_AMBULATORY_CARE_PROVIDER_SITE_OTHER): Payer: PRIVATE HEALTH INSURANCE | Admitting: Nurse Practitioner

## 2019-11-09 DIAGNOSIS — K1120 Sialoadenitis, unspecified: Secondary | ICD-10-CM

## 2019-11-09 NOTE — Progress Notes (Signed)
Virtual Visit via Telephone Note Due to national recommendations of social distancing due to COVID 19, telehealth visit is felt to be most appropriate for this patient at this time.  I discussed the limitations, risks, security and privacy concerns of performing an evaluation and management service by telephone and the availability of in person appointments. I also discussed with the patient that there may be a patient responsible charge related to this service. The patient expressed understanding and agreed to proceed.    I connected with Charles Schultz on 11/09/19  at  11:10 AM EST  EDT by telephone and verified that I am speaking with the correct person using two identifiers.   Consent I discussed the limitations, risks, security and privacy concerns of performing an evaluation and management service by telephone and the availability of in person appointments. I also discussed with the patient that there may be a patient responsible charge related to this service. The patient expressed understanding and agreed to proceed.   Location of Patient: Private Residence    Location of Provider: [facility]    Persons participating in Telemedicine visit: Bertram Denver FNP-BC [place] CMA Charles Schultz    History of Present Illness: Telemedicine visit for:  Sialoadenitis  Currently stable. He is requesting to be referred to ENT. Has insurance (private) now. It is not clear if he actually needs a referral as his insurance is not through [facility]. I will place referral however I have also given him the number to schedule with ENT himself.  [place] of head and neck 01-22-2018 Ultrasound survey in the region of the left submandibular gland suggests sialoadenitis and reactive lymphadenopathy.   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 Smokes marijuana. He states he lost his health insurance last year and has never been evaluated by an ENT specialist. He  has been treated in the past with several ABX and NSAIDs.   He currently denies any fever, dysphagia, pain or tenderness. He has not tried any medications over the counter for his symptoms.    Past Medical History:  Diagnosis Date  . Sialadenitis     Past Surgical History:  Procedure Laterality Date  . NO PAST SURGERIES      Family History  Problem Relation Age of Onset  . Diabetes Father   . Prostate cancer Father     Social History   Socioeconomic History  . Marital status: Married    Spouse name: Not on file  . Number of children: Not on file  . Years of education: Not on file  . Highest education level: Not on file  Occupational History  . Not on file  Social Needs  . Financial resource strain: Not on file  . Food insecurity    Worry: Not on file    Inability: Not on file  . Transportation needs    Medical: Not on file    Non-medical: Not on file  Tobacco Use  . Smoking status: Never Smoker  . Smokeless tobacco: Never Used  . Tobacco comment: still smoking Marijuana   Substance and Sexual Activity  . Alcohol use: Yes    Comment: occasionally   . Drug use: Yes    Types: Marijuana  . Sexual activity: Yes  Lifestyle  . Physical activity    Days per week: Not on file    Minutes per session: Not on file  . Stress: Not on file  Relationships  . Social connections  Talks on phone: Not on file    Gets together: Not on file    Attends religious service: Not on file    Active member of club or organization: Not on file    Attends meetings of clubs or organizations: Not on file    Relationship status: Not on file  Other Topics Concern  . Not on file  Social History Narrative  . Not on file     Observations/Objective: Awake, alert and oriented x 3   Review of Systems  Constitutional: Negative for fever, malaise/fatigue and weight loss.  HENT: Negative.  Negative for nosebleeds.   Eyes: Negative.  Negative for blurred vision, double vision and  photophobia.  Respiratory: Negative.  Negative for cough and shortness of breath.   Cardiovascular: Negative.  Negative for chest pain, palpitations and leg swelling.  Gastrointestinal: Negative.  Negative for heartburn, nausea and vomiting.  Musculoskeletal: Negative.  Negative for myalgias.  Neurological: Negative.  Negative for dizziness, focal weakness, seizures and headaches.  Psychiatric/Behavioral: Negative.  Negative for suicidal ideas.    Assessment and Plan:  Diagnoses and all orders for this visit:  Sialoadenitis -     Ambulatory referral to ENT     Follow Up Instructions Return if symptoms worsen or fail to improve.     I discussed the assessment and treatment plan with the patient. The patient was provided an opportunity to ask questions and all were answered. The patient agreed with the plan and demonstrated an understanding of the instructions.   The patient was advised to call back or seek an in-person evaluation if the symptoms worsen or if the condition fails to improve as anticipated.  I provided 12 minutes of non-face-to-face time during this encounter including median intraservice time, reviewing previous notes, labs, imaging, medications and explaining diagnosis and management.  Gildardo Pounds, FNP-BC

## 2019-11-09 NOTE — Progress Notes (Signed)
Patient states that the swelling mostly cleared up. Is waiting to hear back from ENT to schedule an appt.

## 2019-11-10 NOTE — Telephone Encounter (Signed)
Patient has an appointment with Dr Lucia Gaskins  11-11-2019 @2 :30pm

## 2019-11-11 ENCOUNTER — Encounter (INDEPENDENT_AMBULATORY_CARE_PROVIDER_SITE_OTHER): Payer: Self-pay | Admitting: Otolaryngology

## 2019-11-11 ENCOUNTER — Ambulatory Visit (INDEPENDENT_AMBULATORY_CARE_PROVIDER_SITE_OTHER): Payer: PRIVATE HEALTH INSURANCE | Admitting: Otolaryngology

## 2019-11-11 ENCOUNTER — Other Ambulatory Visit: Payer: Self-pay

## 2019-11-11 VITALS — Temp 98.2°F

## 2019-11-11 DIAGNOSIS — K115 Sialolithiasis: Secondary | ICD-10-CM | POA: Diagnosis not present

## 2019-11-11 NOTE — H&P (View-Only) (Signed)
HPI: Charles Schultz is a 38 y.o. male who presents for evaluation of chronic problems with recurrent swelling of his left submandibular gland.  He has had this for several years.  He has frequently been placed on antibiotics because of swelling and pain associated with the left submandibular gland.  Every time he eats the gland has a tendency to swell.  It is slightly swollen presently but not really tender.  He has had no problems on the right side. He smokes marijuana.  Past Medical History:  Diagnosis Date  . Sialadenitis    Past Surgical History:  Procedure Laterality Date  . NO PAST SURGERIES     Social History   Socioeconomic History  . Marital status: Married    Spouse name: Not on file  . Number of children: Not on file  . Years of education: Not on file  . Highest education level: Not on file  Occupational History  . Not on file  Social Needs  . Financial resource strain: Not on file  . Food insecurity    Worry: Not on file    Inability: Not on file  . Transportation needs    Medical: Not on file    Non-medical: Not on file  Tobacco Use  . Smoking status: Never Smoker  . Smokeless tobacco: Never Used  . Tobacco comment: still smoking Marijuana   Substance and Sexual Activity  . Alcohol use: Yes    Comment: occasionally   . Drug use: Yes    Frequency: 3.0 times per week    Types: Marijuana  . Sexual activity: Yes  Lifestyle  . Physical activity    Days per week: Not on file    Minutes per session: Not on file  . Stress: Not on file  Relationships  . Social Herbalist on phone: Not on file    Gets together: Not on file    Attends religious service: Not on file    Active member of club or organization: Not on file    Attends meetings of clubs or organizations: Not on file    Relationship status: Not on file  Other Topics Concern  . Not on file  Social History Narrative  . Not on file   Family History  Problem Relation Age of Onset  .  Diabetes Father   . Prostate cancer Father    No Known Allergies Prior to Admission medications   Not on File     Positive ROS: Except for chronic submandibular gland problems he is otherwise healthy with negative review of systems.  All other systems have been reviewed and were otherwise negative with the exception of those mentioned in the HPI and as above.  Physical Exam: General: Alert, no acute distress Ears: External ears appear normal.  Ear canals are clear bilaterally with intact, clear TMs Nasal: External nose is normal.  Septum relatively midline with mild rhinitis.  No signs of infection.  No polyps. Oral: Lips dentition alveolar mucosa is normal.  Tonsils normal.  Oropharynx clear.  On evaluation of the left floor of mouth patient has a opening in the proximal submandibular duct with exposed sialolith.  Using topical anesthesia as well as injection with Xylocaine with epinephrine the small opening was enlarged in the stone was attempted to be removed however this was very adherent and much larger than expected and I was unable to entirely remove the stone.  The stone was probably 1.5 x 2.5 cm in size.  I was able to remove approximately 1/4-1/3 of the stone as it had a tendency to crumble but was very adherent to the submandibular gland. Neck: No palpable adenopathy or masses.  Enlarged left submandibular gland with no surrounding erythema or swelling. 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  Plan: Attempted removal of the stone in the office was unsuccessful.  I placed him on Keflex 500 mg 3 times daily for 10 days and we will plan on removing the left submandibular gland in the next few weeks.  Narda Bonds, MD

## 2019-11-11 NOTE — Progress Notes (Signed)
HPI: Charles Schultz is a 38 y.o. male who presents for evaluation of chronic problems with recurrent swelling of his left submandibular gland.  He has had this for several years.  He has frequently been placed on antibiotics because of swelling and pain associated with the left submandibular gland.  Every time he eats the gland has a tendency to swell.  It is slightly swollen presently but not really tender.  He has had no problems on the right side. He smokes marijuana.  Past Medical History:  Diagnosis Date  . Sialadenitis    Past Surgical History:  Procedure Laterality Date  . NO PAST SURGERIES     Social History   Socioeconomic History  . Marital status: Married    Spouse name: Not on file  . Number of children: Not on file  . Years of education: Not on file  . Highest education level: Not on file  Occupational History  . Not on file  Social Needs  . Financial resource strain: Not on file  . Food insecurity    Worry: Not on file    Inability: Not on file  . Transportation needs    Medical: Not on file    Non-medical: Not on file  Tobacco Use  . Smoking status: Never Smoker  . Smokeless tobacco: Never Used  . Tobacco comment: still smoking Marijuana   Substance and Sexual Activity  . Alcohol use: Yes    Comment: occasionally   . Drug use: Yes    Frequency: 3.0 times per week    Types: Marijuana  . Sexual activity: Yes  Lifestyle  . Physical activity    Days per week: Not on file    Minutes per session: Not on file  . Stress: Not on file  Relationships  . Social Herbalist on phone: Not on file    Gets together: Not on file    Attends religious service: Not on file    Active member of club or organization: Not on file    Attends meetings of clubs or organizations: Not on file    Relationship status: Not on file  Other Topics Concern  . Not on file  Social History Narrative  . Not on file   Family History  Problem Relation Age of Onset  .  Diabetes Father   . Prostate cancer Father    No Known Allergies Prior to Admission medications   Not on File     Positive ROS: Except for chronic submandibular gland problems he is otherwise healthy with negative review of systems.  All other systems have been reviewed and were otherwise negative with the exception of those mentioned in the HPI and as above.  Physical Exam: General: Alert, no acute distress Ears: External ears appear normal.  Ear canals are clear bilaterally with intact, clear TMs Nasal: External nose is normal.  Septum relatively midline with mild rhinitis.  No signs of infection.  No polyps. Oral: Lips dentition alveolar mucosa is normal.  Tonsils normal.  Oropharynx clear.  On evaluation of the left floor of mouth patient has a opening in the proximal submandibular duct with exposed sialolith.  Using topical anesthesia as well as injection with Xylocaine with epinephrine the small opening was enlarged in the stone was attempted to be removed however this was very adherent and much larger than expected and I was unable to entirely remove the stone.  The stone was probably 1.5 x 2.5 cm in size.  I was able to remove approximately 1/4-1/3 of the stone as it had a tendency to crumble but was very adherent to the submandibular gland. Neck: No palpable adenopathy or masses.  Enlarged left submandibular gland with no surrounding erythema or swelling. 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  Plan: Attempted removal of the stone in the office was unsuccessful.  I placed him on Keflex 500 mg 3 times daily for 10 days and we will plan on removing the left submandibular gland in the next few weeks.  Narda Bonds, MD

## 2019-11-23 ENCOUNTER — Other Ambulatory Visit: Payer: Self-pay

## 2019-11-23 ENCOUNTER — Encounter (HOSPITAL_BASED_OUTPATIENT_CLINIC_OR_DEPARTMENT_OTHER): Payer: Self-pay | Admitting: *Deleted

## 2019-11-29 ENCOUNTER — Other Ambulatory Visit (HOSPITAL_COMMUNITY)
Admission: RE | Admit: 2019-11-29 | Discharge: 2019-11-29 | Disposition: A | Discharge status: Home / Self Care | Payer: PRIVATE HEALTH INSURANCE | Source: Ambulatory Visit | Attending: Otolaryngology | Admitting: Otolaryngology

## 2019-11-29 DIAGNOSIS — Z20828 Contact with and (suspected) exposure to other viral communicable diseases: Secondary | ICD-10-CM | POA: Insufficient documentation

## 2019-11-29 DIAGNOSIS — Z01812 Encounter for preprocedural laboratory examination: Secondary | ICD-10-CM | POA: Insufficient documentation

## 2019-11-30 ENCOUNTER — Ambulatory Visit (INDEPENDENT_AMBULATORY_CARE_PROVIDER_SITE_OTHER): Payer: Self-pay | Admitting: Otolaryngology

## 2019-11-30 DIAGNOSIS — K115 Sialolithiasis: Secondary | ICD-10-CM

## 2019-11-30 LAB — NOVEL CORONAVIRUS, NAA (HOSP ORDER, SEND-OUT TO REF LAB; TAT 18-24 HRS): SARS-CoV-2, NAA: NOT DETECTED

## 2019-12-02 ENCOUNTER — Ambulatory Visit (HOSPITAL_BASED_OUTPATIENT_CLINIC_OR_DEPARTMENT_OTHER): Payer: PRIVATE HEALTH INSURANCE | Admitting: Certified Registered"

## 2019-12-02 ENCOUNTER — Other Ambulatory Visit: Payer: Self-pay

## 2019-12-02 ENCOUNTER — Ambulatory Visit (HOSPITAL_BASED_OUTPATIENT_CLINIC_OR_DEPARTMENT_OTHER)
Admission: RE | Admit: 2019-12-02 | Discharge: 2019-12-02 | Disposition: A | Discharge status: Home / Self Care | Payer: PRIVATE HEALTH INSURANCE | Attending: Otolaryngology | Admitting: Otolaryngology

## 2019-12-02 ENCOUNTER — Encounter (HOSPITAL_BASED_OUTPATIENT_CLINIC_OR_DEPARTMENT_OTHER)
Admission: RE | Disposition: A | Discharge status: Home / Self Care | Payer: Self-pay | Source: Home / Self Care | Attending: Otolaryngology

## 2019-12-02 ENCOUNTER — Encounter (HOSPITAL_BASED_OUTPATIENT_CLINIC_OR_DEPARTMENT_OTHER): Payer: Self-pay

## 2019-12-02 DIAGNOSIS — K115 Sialolithiasis: Secondary | ICD-10-CM | POA: Insufficient documentation

## 2019-12-02 DIAGNOSIS — K1120 Sialoadenitis, unspecified: Secondary | ICD-10-CM | POA: Insufficient documentation

## 2019-12-02 DIAGNOSIS — K1123 Chronic sialoadenitis: Secondary | ICD-10-CM

## 2019-12-02 HISTORY — PX: SUBMANDIBULAR GLAND EXCISION: SHX2456

## 2019-12-02 SURGERY — EXCISION, SUBMANDIBULAR GLAND
Anesthesia: General | Site: Neck | Laterality: Left

## 2019-12-02 MED ORDER — CEFAZOLIN SODIUM-DEXTROSE 2-4 GM/100ML-% IV SOLN
2.0000 g | INTRAVENOUS | Status: AC
  Administered 2019-12-02: 09:00:00 2 g via INTRAVENOUS

## 2019-12-02 MED ORDER — FENTANYL CITRATE (PF) 100 MCG/2ML IJ SOLN
INTRAMUSCULAR | Status: AC
  Filled 2019-12-02: qty 2

## 2019-12-02 MED ORDER — DEXMEDETOMIDINE HCL IN NACL 200 MCG/50ML IV SOLN
INTRAVENOUS | Status: AC
  Filled 2019-12-02: qty 50

## 2019-12-02 MED ORDER — PHENYLEPHRINE 40 MCG/ML (10ML) SYRINGE FOR IV PUSH (FOR BLOOD PRESSURE SUPPORT)
PREFILLED_SYRINGE | INTRAVENOUS | Status: AC
  Filled 2019-12-02: qty 10

## 2019-12-02 MED ORDER — ACETAMINOPHEN 325 MG PO TABS: 325.0000 mg | ORAL_TABLET | ORAL | Status: DC | PRN

## 2019-12-02 MED ORDER — PHENYLEPHRINE HCL (PRESSORS) 10 MG/ML IV SOLN
INTRAVENOUS | Status: DC | PRN
  Administered 2019-12-02: 40 ug via INTRAVENOUS

## 2019-12-02 MED ORDER — LACTATED RINGERS IV SOLN
INTRAVENOUS | Status: DC
  Administered 2019-12-02 (×2): via INTRAVENOUS

## 2019-12-02 MED ORDER — ONDANSETRON HCL 4 MG/2ML IJ SOLN
INTRAMUSCULAR | Status: AC
  Filled 2019-12-02: qty 4

## 2019-12-02 MED ORDER — SUCCINYLCHOLINE CHLORIDE 200 MG/10ML IV SOSY
PREFILLED_SYRINGE | INTRAVENOUS | Status: AC
  Filled 2019-12-02: qty 20

## 2019-12-02 MED ORDER — LIDOCAINE 2% (20 MG/ML) 5 ML SYRINGE
INTRAMUSCULAR | Status: AC
  Filled 2019-12-02: qty 15

## 2019-12-02 MED ORDER — LIDOCAINE-EPINEPHRINE 1 %-1:100000 IJ SOLN
INTRAMUSCULAR | Status: AC
  Filled 2019-12-02: qty 1

## 2019-12-02 MED ORDER — SUGAMMADEX SODIUM 200 MG/2ML IV SOLN
INTRAVENOUS | Status: DC | PRN
  Administered 2019-12-02: 150 mg via INTRAVENOUS

## 2019-12-02 MED ORDER — EPHEDRINE 5 MG/ML INJ
INTRAVENOUS | Status: AC
  Filled 2019-12-02: qty 10

## 2019-12-02 MED ORDER — DEXAMETHASONE SODIUM PHOSPHATE 10 MG/ML IJ SOLN
INTRAMUSCULAR | Status: DC | PRN
  Administered 2019-12-02: 10 mg via INTRAVENOUS

## 2019-12-02 MED ORDER — LIDOCAINE-EPINEPHRINE 1 %-1:100000 IJ SOLN
INTRAMUSCULAR | Status: DC | PRN
  Administered 2019-12-02: 4 mL

## 2019-12-02 MED ORDER — ROCURONIUM BROMIDE 10 MG/ML (PF) SYRINGE
PREFILLED_SYRINGE | INTRAVENOUS | Status: AC
  Filled 2019-12-02: qty 20

## 2019-12-02 MED ORDER — DEXAMETHASONE SODIUM PHOSPHATE 10 MG/ML IJ SOLN
INTRAMUSCULAR | Status: AC
  Filled 2019-12-02: qty 2

## 2019-12-02 MED ORDER — ROCURONIUM BROMIDE 100 MG/10ML IV SOLN
INTRAVENOUS | Status: DC | PRN
  Administered 2019-12-02: 50 mg via INTRAVENOUS

## 2019-12-02 MED ORDER — FENTANYL CITRATE (PF) 100 MCG/2ML IJ SOLN
INTRAMUSCULAR | Status: DC | PRN
  Administered 2019-12-02 (×2): 25 ug via INTRAVENOUS
  Administered 2019-12-02: 50 ug via INTRAVENOUS
  Administered 2019-12-02: 100 ug via INTRAVENOUS
  Administered 2019-12-02: 25 ug via INTRAVENOUS

## 2019-12-02 MED ORDER — FENTANYL CITRATE (PF) 100 MCG/2ML IJ SOLN: 25.0000 ug | INTRAMUSCULAR | Status: DC | PRN

## 2019-12-02 MED ORDER — MIDAZOLAM HCL 5 MG/5ML IJ SOLN
INTRAMUSCULAR | Status: DC | PRN
  Administered 2019-12-02: 2 mg via INTRAVENOUS

## 2019-12-02 MED ORDER — CEFAZOLIN SODIUM-DEXTROSE 2-4 GM/100ML-% IV SOLN
INTRAVENOUS | Status: AC
  Filled 2019-12-02: qty 100

## 2019-12-02 MED ORDER — ONDANSETRON HCL 4 MG/2ML IJ SOLN: 4.0000 mg | Freq: Once | INTRAMUSCULAR | Status: DC | PRN

## 2019-12-02 MED ORDER — OXYCODONE HCL 5 MG/5ML PO SOLN: 5.0000 mg | Freq: Once | ORAL | Status: DC | PRN

## 2019-12-02 MED ORDER — OXYCODONE HCL 5 MG PO TABS: 5.0000 mg | ORAL_TABLET | Freq: Once | ORAL | Status: DC | PRN

## 2019-12-02 MED ORDER — ONDANSETRON HCL 4 MG/2ML IJ SOLN
INTRAMUSCULAR | Status: DC | PRN
  Administered 2019-12-02: 4 mg via INTRAVENOUS

## 2019-12-02 MED ORDER — MIDAZOLAM HCL 2 MG/2ML IJ SOLN
INTRAMUSCULAR | Status: AC
  Filled 2019-12-02: qty 2

## 2019-12-02 MED ORDER — PROPOFOL 10 MG/ML IV BOLUS
INTRAVENOUS | Status: DC | PRN
  Administered 2019-12-02: 200 mg via INTRAVENOUS

## 2019-12-02 MED ORDER — BACITRACIN ZINC 500 UNIT/GM EX OINT
TOPICAL_OINTMENT | CUTANEOUS | Status: AC
  Filled 2019-12-02: qty 1.8

## 2019-12-02 MED ORDER — ACETAMINOPHEN 160 MG/5ML PO SOLN: 325.0000 mg | ORAL | Status: DC | PRN

## 2019-12-02 MED ORDER — MEPERIDINE HCL 25 MG/ML IJ SOLN: 6.2500 mg | INTRAMUSCULAR | Status: DC | PRN

## 2019-12-02 MED ORDER — LIDOCAINE 2% (20 MG/ML) 5 ML SYRINGE
INTRAMUSCULAR | Status: DC | PRN
  Administered 2019-12-02: 100 mg via INTRAVENOUS

## 2019-12-02 SURGICAL SUPPLY — 57 items
APL SKNCLS STERI-STRIP NONHPOA (GAUZE/BANDAGES/DRESSINGS) ×1
BENZOIN TINCTURE PRP APPL 2/3 (GAUZE/BANDAGES/DRESSINGS) ×1 IMPLANT
BLADE SURG 15 STRL LF DISP TIS (BLADE) ×1 IMPLANT
BLADE SURG 15 STRL SS (BLADE) ×2
CANISTER SUCT 1200ML W/VALVE (MISCELLANEOUS) ×2 IMPLANT
CLEANER CAUTERY TIP 5X5 PAD (MISCELLANEOUS) IMPLANT
CORD BIPOLAR FORCEPS 12FT (ELECTRODE) ×1 IMPLANT
COVER BACK TABLE REUSABLE LG (DRAPES) ×2 IMPLANT
COVER MAYO STAND REUSABLE (DRAPES) ×2 IMPLANT
COVER WAND RF STERILE (DRAPES) IMPLANT
DECANTER SPIKE VIAL GLASS SM (MISCELLANEOUS) IMPLANT
DRAPE U-SHAPE 76X120 STRL (DRAPES) ×2 IMPLANT
ELECT COATED BLADE 2.86 ST (ELECTRODE) ×2 IMPLANT
ELECT REM PT RETURN 9FT ADLT (ELECTROSURGICAL) ×2
ELECTRODE REM PT RTRN 9FT ADLT (ELECTROSURGICAL) ×1 IMPLANT
GAUZE 4X4 16PLY RFD (DISPOSABLE) IMPLANT
GAUZE SPONGE 4X4 12PLY STRL LF (GAUZE/BANDAGES/DRESSINGS) IMPLANT
GLOVE BIO SURGEON STRL SZ 6.5 (GLOVE) ×1 IMPLANT
GLOVE BIOGEL PI IND STRL 8 (GLOVE) IMPLANT
GLOVE BIOGEL PI INDICATOR 8 (GLOVE) ×2
GLOVE SS BIOGEL STRL SZ 7.5 (GLOVE) ×1 IMPLANT
GLOVE SUPERSENSE BIOGEL SZ 7.5 (GLOVE) ×1
GOWN STRL REUS W/ TWL LRG LVL3 (GOWN DISPOSABLE) ×1 IMPLANT
GOWN STRL REUS W/TWL LRG LVL3 (GOWN DISPOSABLE) ×4
HEMOSTAT SURGICEL .5X2 ABSORB (HEMOSTASIS) IMPLANT
LOCATOR NERVE 3 VOLT (DISPOSABLE) IMPLANT
NDL HYPO 25X1 1.5 SAFETY (NEEDLE) ×1 IMPLANT
NEEDLE HYPO 25X1 1.5 SAFETY (NEEDLE) ×2 IMPLANT
NS IRRIG 1000ML POUR BTL (IV SOLUTION) ×2 IMPLANT
PACK BASIN DAY SURGERY FS (CUSTOM PROCEDURE TRAY) ×2 IMPLANT
PAD CLEANER CAUTERY TIP 5X5 (MISCELLANEOUS)
PENCIL SMOKE EVACUATOR (MISCELLANEOUS) ×2 IMPLANT
SLEEVE SCD COMPRESS KNEE MED (MISCELLANEOUS) ×2 IMPLANT
SPONGE INTESTINAL PEANUT (DISPOSABLE) ×1 IMPLANT
STRIP CLOSURE SKIN 1/2X4 (GAUZE/BANDAGES/DRESSINGS) ×1 IMPLANT
STRIP CLOSURE SKIN 1/4X4 (GAUZE/BANDAGES/DRESSINGS) IMPLANT
SUCTION FRAZIER HANDLE 10FR (MISCELLANEOUS) ×1
SUCTION TUBE FRAZIER 10FR DISP (MISCELLANEOUS) IMPLANT
SUT CHROMIC 3 0 PS 2 (SUTURE) ×1 IMPLANT
SUT CHROMIC 3 0 SH 27 (SUTURE) ×1 IMPLANT
SUT ETHILON 4 0 PS 2 18 (SUTURE) IMPLANT
SUT ETHILON 5 0 P 3 18 (SUTURE) ×1
SUT NYLON ETHILON 5-0 P-3 1X18 (SUTURE) ×1 IMPLANT
SUT SILK 2 0 TIES 17X18 (SUTURE)
SUT SILK 2-0 18XBRD TIE BLK (SUTURE) IMPLANT
SUT SILK 3 0 SH 30 (SUTURE) IMPLANT
SUT SILK 3 0 TIES 17X18 (SUTURE) ×2
SUT SILK 3-0 18XBRD TIE BLK (SUTURE) ×1 IMPLANT
SUT VIC AB 5-0 P-3 18X BRD (SUTURE) IMPLANT
SUT VIC AB 5-0 P3 18 (SUTURE)
SWAB COLLECTION DEVICE MRSA (MISCELLANEOUS) IMPLANT
SWAB CULTURE ESWAB REG 1ML (MISCELLANEOUS) IMPLANT
SYR BULB 3OZ (MISCELLANEOUS) ×2 IMPLANT
SYR CONTROL 10ML LL (SYRINGE) ×2 IMPLANT
TOWEL GREEN STERILE FF (TOWEL DISPOSABLE) ×4 IMPLANT
TRAY DSU PREP LF (CUSTOM PROCEDURE TRAY) ×2 IMPLANT
TUBE CONNECTING 20X1/4 (TUBING) ×2 IMPLANT

## 2019-12-02 NOTE — Transfer of Care (Signed)
Immediate Anesthesia Transfer of Care Note  Patient: [name]  Procedure(s) Performed: EXCISION LEFT SUBMANDIBULAR GLAND (Left Neck)  Patient Location: PACU  Anesthesia Type:General  Level of Consciousness: drowsy  Airway & Oxygen Therapy: Patient Spontanous Breathing and Patient connected to face mask oxygen  Post-op Assessment: Report given to RN and Post -op Vital signs reviewed and stable  Post vital signs: Reviewed and stable  Last Vitals:  Vitals Value Taken Time  BP 159/88 12/02/19 1123  Temp    Pulse 79 12/02/19 1127  Resp 17 12/02/19 1127  SpO2 100 % 12/02/19 1127  Vitals shown include unvalidated device data.  Last Pain:  Vitals:   12/02/19 0708  TempSrc: Tympanic  PainSc: 0-No pain      Patients Stated Pain Goal: 8 (33/38/32 9191)  Complications: No apparent anesthesia complications

## 2019-12-02 NOTE — Anesthesia Postprocedure Evaluation (Signed)
Anesthesia Post Note  Patient: MELFORD TULLIER  Procedure(s) Performed: EXCISION LEFT SUBMANDIBULAR GLAND (Left Neck)     Patient location during evaluation: PACU Anesthesia Type: General Level of consciousness: awake and alert Pain management: pain level controlled Vital Signs Assessment: post-procedure vital signs reviewed and stable Respiratory status: spontaneous breathing, nonlabored ventilation, respiratory function stable and patient connected to nasal cannula oxygen Cardiovascular status: blood pressure returned to baseline and stable Postop Assessment: no apparent nausea or vomiting Anesthetic complications: no    Last Vitals:  Vitals:   12/02/19 1215 12/02/19 1300  BP: (!) 154/84 (!) 158/93  Pulse: 73 70  Resp: 12 16  Temp:  37 C  SpO2: 100% 100%    Last Pain:  Vitals:   12/02/19 1256  TempSrc:   PainSc: 0-No pain                 Seon Gaertner

## 2019-12-02 NOTE — Op Note (Signed)
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  POSTOPERATIVE DIAGNOSIS:  Chronic left submandibular sialadenitis with history of sialolithiasis.  OPERATION PERFORMED:  Excision of left submandibular gland with removal of a large 3.5 cm x 2 cm stone.  SURGEON:  Melony Overly, MD  ANESTHESIA:  General endotracheal.  COMPLICATIONS:  None.  BRIEF CLINICAL NOTE:  The patient is a 38 year old gentleman who has had chronic history of recurrent swelling of the left submandibular gland.  On exam in the office, the patient has a large proximal left submandibular stone that was attempted to be  removed in the office, but was very large and adherent to the gland itself.  The patient is taken to the operating room at this time for excision of left submandibular gland with removal of the large stone.  DESCRIPTION OF PROCEDURE:  After adequate endotracheal anesthesia, the patient received 2 grams Ancef IV preoperatively as well as 2 grams Ancef IV preoperatively.  The left neck was prepped with Betadine solution, draped out in sterile towels.  The  proposed incision site was marked ____ with 3-4 mL of Xylocaine with epinephrine.  A horizontal incision was made just at the inferior aspect of the submandibular gland.  Dissection was carried down through subcutaneous tissue and platysma muscle.  The  jugular vein and facial artery were identified in the posterior aspect of the gland.  The facial vein was divided, ligated and retracted superiorly so as to protect the marginal nerve.  The branches of the facial artery were ligated with 2-0 silk sutures  and divided.  The gland was then carefully dissected out of the submandibular fossa.   Of note, deep within the gland a large stone was identified at the junction of the submandibular duct.  The lingual nerve was identified and preserved superiorly.  The  duct had to be transected at the inferior aspect going directly over the stone.  The stone was very large, measuring approximately 2 cm in width and probably 3.5 cm in length.  The stone had to be grasped and at first broke a little bit.  Then, finally  we were able to grasp the stone and remove the whole stone from the remaining duct.  There was minimal bleeding controlled with bipolar cautery.  The large duct defect was closed with running 3-0 chromic suture so as to close the entire duct.  Additional  3-0 chromic suture was placed to cover this with surrounding tissue.  Wound was irrigated with saline copiously and closed with 3-0 chromic sutures, reapproximating the platysma muscle, a 5-0 chromic subcuticular stitch followed by Dermabond glue.  The  patient was awoken from anesthesia and transferred to recovery room, postoperatively doing well.  DISPOSITION:  The patient is discharged home later this morning on Keflex, which he already has 5 mg t.i.d. for the next 4 days.  Tylenol, ibuprofen and hydrocodone p.r.n. pain.  He will follow up in my office in 1 week for recheck.  CN/NUANCE  D:12/02/2019 T:12/02/2019 JOB:[id number]

## 2019-12-02 NOTE — Brief Op Note (Signed)
12/02/2019  11:18 AM  PATIENT:  Charles Schultz  38 y.o. male  PRE-OPERATIVE DIAGNOSIS:  CHRONIC LEFT SUBMANDIBULAR SIALOADENITIS WITH SIALOLITHIASIS  POST-OPERATIVE DIAGNOSIS:  CHRONIC LEFT SUBMANDIBULAR INFECTION  PROCEDURE:  Procedure(s): EXCISION LEFT SUBMANDIBULAR GLAND (Left) AND LARGE 2X3.5 cm STONE  SURGEON:  Surgeon(s) and Role:    * Lucia Gaskins, Leonides Sake, MD - Primary  PHYSICIAN ASSISTANT:   ASSISTANTS: none   ANESTHESIA:   general  EBL:  25 mL   BLOOD ADMINISTERED:none  DRAINS: none   LOCAL MEDICATIONS USED:  XYLOCAINE   SPECIMEN:  Source of Specimen:  left submandibular gland  DISPOSITION OF SPECIMEN:  PATHOLOGY  COUNTS:  YES  TOURNIQUET:  * No tourniquets in log *  DICTATION: .Other Dictation: Dictation Number [id number]  PLAN OF CARE: Discharge to home after PACU  PATIENT DISPOSITION:  PACU - hemodynamically stable.   Delay start of Pharmacological VTE agent (>24hrs) due to surgical blood loss or risk of bleeding: yes

## 2019-12-02 NOTE — Anesthesia Preprocedure Evaluation (Signed)
Anesthesia Evaluation  Patient identified by MRN, date of birth, ID band Patient awake    Reviewed: Allergy & Precautions, H&P , NPO status , Patient's Chart, lab work & pertinent test results, reviewed documented beta blocker date and time   Airway Mallampati: II  TM Distance: >3 FB Neck ROM: full    Dental no notable dental hx.    Pulmonary neg pulmonary ROS,    Pulmonary exam normal breath sounds clear to auscultation       Cardiovascular Exercise Tolerance: Good negative cardio ROS   Rhythm:regular Rate:Normal     Neuro/Psych negative neurological ROS  negative psych ROS   GI/Hepatic negative GI ROS, Neg liver ROS,   Endo/Other  negative endocrine ROS  Renal/GU negative Renal ROS  negative genitourinary   Musculoskeletal   Abdominal   Peds  Hematology negative hematology ROS (+)   Anesthesia Other Findings   Reproductive/Obstetrics negative OB ROS                             Anesthesia Physical Anesthesia Plan  ASA: II  Anesthesia Plan: General   Post-op Pain Management:    Induction: Intravenous  PONV Risk Score and Plan:   Airway Management Planned: Oral ETT  Additional Equipment:   Intra-op Plan:   Post-operative Plan: Extubation in OR  Informed Consent: I have reviewed the patients History and Physical, chart, labs and discussed the procedure including the risks, benefits and alternatives for the proposed anesthesia with the patient or authorized representative who has indicated his/her understanding and acceptance.     Dental Advisory Given  Plan Discussed with: CRNA, Anesthesiologist and Surgeon  Anesthesia Plan Comments: (  )        Anesthesia Quick Evaluation

## 2019-12-02 NOTE — Anesthesia Procedure Notes (Signed)
Procedure Name: Intubation Date/Time: 12/02/2019 8:55 AM Performed by: Lavonia Dana, CRNA Pre-anesthesia Checklist: Patient identified, Emergency Drugs available, Suction available and Patient being monitored Patient Re-evaluated:Patient Re-evaluated prior to induction Oxygen Delivery Method: Circle system utilized Preoxygenation: Pre-oxygenation with 100% oxygen Induction Type: IV induction Ventilation: Mask ventilation without difficulty Laryngoscope Size: Miller and 2 Grade View: Grade I Tube type: Oral Tube size: 7.0 mm Number of attempts: 1 Airway Equipment and Method: Stylet,  Oral airway and Bite block Placement Confirmation: ETT inserted through vocal cords under direct vision,  positive ETCO2 and breath sounds checked- equal and bilateral Secured at: 22 cm Tube secured with: Tape Dental Injury: Teeth and Oropharynx as per pre-operative assessment

## 2019-12-02 NOTE — Discharge Instructions (Addendum)
Keep incision site dry for the next 24 hrs Take Keflex 500 mg three times per day for the next 5 days Tylenol, ibuprofen or hydrocodone 5 mg every 6 hrs prn pain Call office if any problems or questions Return to office next Friday for recheck    Post Anesthesia Home Care Instructions  Activity: Get plenty of rest for the remainder of the day. A responsible individual must stay with you for 24 hours following the procedure.  For the next 24 hours, DO NOT: -Drive a car -Paediatric nurse -Drink alcoholic beverages -Take any medication unless instructed by your physician -Make any legal decisions or sign important papers.  Meals: Start with liquid foods such as gelatin or soup. Progress to regular foods as tolerated. Avoid greasy, spicy, heavy foods. If nausea and/or vomiting occur, drink only clear liquids until the nausea and/or vomiting subsides. Call your physician if vomiting continues.  Special Instructions/Symptoms: Your throat may feel dry or sore from the anesthesia or the breathing tube placed in your throat during surgery. If this causes discomfort, gargle with warm salt water. The discomfort should disappear within 24 hours.  If you had a scopolamine patch placed behind your ear for the management of post- operative nausea and/or vomiting:  1. The medication in the patch is effective for 72 hours, after which it should be removed.  Wrap patch in a tissue and discard in the trash. Wash hands thoroughly with soap and water. 2. You may remove the patch earlier than 72 hours if you experience unpleasant side effects which may include dry mouth, dizziness or visual disturbances. 3. Avoid touching the patch. Wash your hands with soap and water after contact with the patch.    Call your surgeon if you experience:   1.  Fever over 101.0. 2.  Inability to urinate. 3.  Nausea and/or vomiting. 4.  Extreme swelling or bruising at the surgical site. 5.  Continued bleeding from the  incision. 6.  Increased pain, redness or drainage from the incision. 7.  Problems related to your pain medication. 8.  Any problems and/or concerns

## 2019-12-02 NOTE — Interval H&P Note (Signed)
History and Physical Interval Note:  12/02/2019 8:37 AM  Charles Schultz  has presented today for surgery, with the diagnosis of CHRONIC LEFT SUBMANDIBULAR SIALADENOPATHY.  The various methods of treatment have been discussed with the patient and family. After consideration of risks, benefits and other options for treatment, the patient has consented to  Procedure(s): EXCISION LEFT SUBMANDIBULAR GLAND (Left) as a surgical intervention.  The patient's history has been reviewed, patient examined, no change in status, stable for surgery.  I have reviewed the patient's chart and labs.  Questions were answered to the patient's satisfaction.     Melony Overly

## 2019-12-05 ENCOUNTER — Other Ambulatory Visit: Payer: Self-pay

## 2019-12-05 ENCOUNTER — Encounter (HOSPITAL_BASED_OUTPATIENT_CLINIC_OR_DEPARTMENT_OTHER): Payer: Self-pay | Admitting: Otolaryngology

## 2019-12-06 LAB — SURGICAL PATHOLOGY

## 2019-12-09 ENCOUNTER — Other Ambulatory Visit: Payer: Self-pay

## 2019-12-09 ENCOUNTER — Encounter (INDEPENDENT_AMBULATORY_CARE_PROVIDER_SITE_OTHER): Payer: Self-pay | Admitting: Otolaryngology

## 2019-12-09 ENCOUNTER — Ambulatory Visit (INDEPENDENT_AMBULATORY_CARE_PROVIDER_SITE_OTHER): Payer: PRIVATE HEALTH INSURANCE | Admitting: Otolaryngology

## 2019-12-09 VITALS — Temp 98.2°F

## 2019-12-09 DIAGNOSIS — Z4889 Encounter for other specified surgical aftercare: Secondary | ICD-10-CM

## 2019-12-09 NOTE — Progress Notes (Signed)
HPI: Charles Schultz is a 38 y.o. male who presents 7 days s/p left submandibular gland removal and removal of large submandibular stone.  He is doing well has minimal discomfort along the left neck where he has little to swelling.  No fever.  Intraorally everything feels great..   Past Medical History:  Diagnosis Date  . Sialadenitis    Past Surgical History:  Procedure Laterality Date  . NO PAST SURGERIES    . SUBMANDIBULAR GLAND EXCISION Left 12/02/2019   Procedure: EXCISION LEFT SUBMANDIBULAR GLAND;  Surgeon: Rozetta Nunnery, MD;  Location: [facility];  Service: ENT;  Laterality: Left;   Social History   Socioeconomic History  . Marital status: Married    Spouse name: Not on file  . Number of children: Not on file  . Years of education: Not on file  . Highest education level: Not on file  Occupational History  . Not on file  Tobacco Use  . Smoking status: Never Smoker  . Smokeless tobacco: Never Used  . Tobacco comment: still smoking Marijuana   Substance and Sexual Activity  . Alcohol use: Yes    Comment: occasionally   . Drug use: Yes    Frequency: 3.0 times per week    Types: Marijuana  . Sexual activity: Yes  Other Topics Concern  . Not on file  Social History Narrative  . Not on file   Social Determinants of Health   Financial Resource Strain:   . Difficulty of Paying Living Expenses: Not on file  Food Insecurity:   . Worried About Charity fundraiser in the Last Year: Not on file  . Ran Out of Food in the Last Year: Not on file  Transportation Needs:   . Lack of Transportation (Medical): Not on file  . Lack of Transportation (Non-Medical): Not on file  Physical Activity:   . Days of Exercise per Week: Not on file  . Minutes of Exercise per Session: Not on file  Stress:   . Feeling of Stress : Not on file  Social Connections:   . Frequency of Communication with Friends and Family: Not on file  . Frequency of Social Gatherings with  Friends and Family: Not on file  . Attends Religious Services: Not on file  . Active Member of Clubs or Organizations: Not on file  . Attends Archivist Meetings: Not on file  . Marital Status: Not on file   Family History  Problem Relation Age of Onset  . Diabetes Father   . Prostate cancer Father    No Known Allergies Prior to Admission medications   Not on File     Physical Exam: He has mild swelling in the left semitubular region consistent with some residual fluid but no erythema and minimal tenderness.  Intraoral exam reveals well-healed opening along the left floor mouth.   Assessment: S/p excision of left semitubular gland and large stone. Photos were obtained today in the office of the stone that measured 3 x 1.3 cm in size  Plan: He will follow-up in 3 to 4 weeks for recheck. Gave him a prescription for Keflex 500 mg 3 times daily for 1 week if he develops any increasing pain or swelling in the neck.   Radene Journey, MD

## 2019-12-10 ENCOUNTER — Other Ambulatory Visit: Payer: Self-pay

## 2019-12-10 ENCOUNTER — Emergency Department (HOSPITAL_COMMUNITY)
Admission: EM | Admit: 2019-12-10 | Discharge: 2019-12-10 | Disposition: A | Discharge status: Home / Self Care | Payer: PRIVATE HEALTH INSURANCE | Attending: Emergency Medicine | Admitting: Emergency Medicine

## 2019-12-10 ENCOUNTER — Encounter (HOSPITAL_COMMUNITY): Payer: Self-pay | Admitting: Emergency Medicine

## 2019-12-10 DIAGNOSIS — T8189XA Other complications of procedures, not elsewhere classified, initial encounter: Secondary | ICD-10-CM

## 2019-12-10 DIAGNOSIS — Z9889 Other specified postprocedural states: Secondary | ICD-10-CM | POA: Diagnosis not present

## 2019-12-10 NOTE — ED Triage Notes (Signed)
Pt reports had follow up appt for surgery yesterday and Dr Lucia Gaskins said he did have some fluid behind incision but incision was healing good. Reports today had some thin blood drainage from incision and now pressure from fluid is gone. Denies pains or fevers.

## 2019-12-10 NOTE — ED Provider Notes (Signed)
[facility] DEPT Provider Note   CSN: [id number] Arrival date & time: 12/10/19  1653     History Chief Complaint  Patient presents with  . Post-op Problem    Charles Schultz is a 38 y.o. male who presents the emergency department for fluid draining from his surgical wound.  He is status post submandibular salivary gland excision by Dr. Lucia Gaskins of ear nose and throat on 12/02/2019.  Patient states that he was sitting there when he noticed bleeding from his neck.  He applied a bandage to it and then when he went to check it in the bathroom it was just "pouring out."  He came to the emergency for evaluation.  He denies any fevers, chills, difficulty swallowing, pain at the incision site, pain in his mouth or throat.   HPI     Past Medical History:  Diagnosis Date  . Sialadenitis     Patient Active Problem List   Diagnosis Date Noted  . Sialoadenitis 02/24/2018    Past Surgical History:  Procedure Laterality Date  . NO PAST SURGERIES    . SUBMANDIBULAR GLAND EXCISION Left 12/02/2019   Procedure: EXCISION LEFT SUBMANDIBULAR GLAND;  Surgeon: Rozetta Nunnery, MD;  Location: [facility];  Service: ENT;  Laterality: Left;       Family History  Problem Relation Age of Onset  . Diabetes Father   . Prostate cancer Father     Social History   Tobacco Use  . Smoking status: Never Smoker  . Smokeless tobacco: Never Used  . Tobacco comment: still smoking Marijuana   Substance Use Topics  . Alcohol use: Yes    Comment: occasionally   . Drug use: Yes    Frequency: 3.0 times per week    Types: Marijuana    Home Medications Prior to Admission medications   Not on File    Allergies    Patient has no known allergies.  Review of Systems   Review of Systems Ten systems reviewed and are negative for acute change, except as noted in the HPI.   Physical Exam Updated Vital Signs BP (!) 116/97   Pulse 60   Temp 98.7 F (37.1  C) (Oral)   Resp 17   SpO2 98%   Physical Exam Vitals and nursing note reviewed.  Constitutional:      General: He is not in acute distress.    Appearance: He is well-developed. He is not diaphoretic.  HENT:     Head: Normocephalic and atraumatic.     Mouth/Throat:     Pharynx: Uvula midline.  Eyes:     General: No scleral icterus.    Conjunctiva/sclera: Conjunctivae normal.  Neck:   Cardiovascular:     Rate and Rhythm: Normal rate and regular rhythm.     Heart sounds: Normal heart sounds.  Pulmonary:     Effort: Pulmonary effort is normal. No respiratory distress.     Breath sounds: Normal breath sounds.  Abdominal:     Palpations: Abdomen is soft.     Tenderness: There is no abdominal tenderness.  Musculoskeletal:     Cervical back: Normal range of motion and neck supple.  Skin:    General: Skin is warm and dry.  Neurological:     Mental Status: He is alert.  Psychiatric:        Behavior: Behavior normal.     ED Results / Procedures / Treatments   Labs (all labs ordered are listed, but only  abnormal results are displayed) Labs Reviewed - No data to display  EKG None  Radiology No results found.  Procedures Procedures (including critical care time)  Medications Ordered in ED Medications - No data to display  ED Course  I have reviewed the triage vital signs and the nursing notes.  Pertinent labs & imaging results that were available during my care of the patient were reviewed by me and considered in my medical decision making (see chart for details).    MDM Rules/Calculators/A&P     CHA2DS2/VAS Stroke Risk Points      N/A >= 2 Points: High Risk  1 - 1.99 Points: Medium Risk  0 Points: Low Risk    A final score could not be computed because of missing components.: Last  Change: N/A     This score determines the patient's risk of having a stroke if the  patient has atrial fibrillation.      This score is not applicable to this patient.  Components are not  calculated.                   Patient that is post left submandibular salivary gland excision.  He had some serosanguineous drainage from the incision site.  No evidence of infection.  The drainage has resolved.  Applied bandaging.  Counseled the patient on reasons to seek immediate medical care.  He is to call Dr. Ezzard Standing tomorrow and follow-up with the surgeon.  Patient has normal phonation and no concern for airway compromise or deep space infection of the neck.  Patient appears appropriate for discharge at this time Final Clinical Impression(s) / ED Diagnoses Final diagnoses:  None    Rx / DC Orders ED Discharge Orders    None       Arthor Captain, PA-C 12/10/19 2157    Loren Racer, MD 12/11/19 [phone]

## 2019-12-10 NOTE — Discharge Instructions (Addendum)
Get help right away if: You have a red streak coming from your incision. Your incision bleeds through the dressing and the bleeding does not stop with gentle pressure. The edges of your incision open up and separate. You have severe pain. You have a rash. You are confused. You faint. You have trouble breathing and a fast heartbeat.

## 2020-01-04 ENCOUNTER — Encounter (INDEPENDENT_AMBULATORY_CARE_PROVIDER_SITE_OTHER): Payer: PRIVATE HEALTH INSURANCE | Admitting: Otolaryngology

## 2020-01-05 ENCOUNTER — Encounter (INDEPENDENT_AMBULATORY_CARE_PROVIDER_SITE_OTHER): Payer: PRIVATE HEALTH INSURANCE | Admitting: Otolaryngology

## 2020-01-06 ENCOUNTER — Ambulatory Visit: Payer: PRIVATE HEALTH INSURANCE | Attending: Internal Medicine

## 2020-01-06 DIAGNOSIS — Z20822 Contact with and (suspected) exposure to covid-19: Secondary | ICD-10-CM

## 2020-01-07 LAB — NOVEL CORONAVIRUS, NAA: SARS-CoV-2, NAA: DETECTED — AB

## 2020-01-18 ENCOUNTER — Other Ambulatory Visit: Payer: Self-pay

## 2020-01-18 ENCOUNTER — Ambulatory Visit (INDEPENDENT_AMBULATORY_CARE_PROVIDER_SITE_OTHER): Payer: PRIVATE HEALTH INSURANCE | Admitting: Otolaryngology

## 2020-01-18 VITALS — Temp 98.2°F

## 2020-01-18 DIAGNOSIS — Z4889 Encounter for other specified surgical aftercare: Secondary | ICD-10-CM

## 2020-01-18 NOTE — Progress Notes (Signed)
HPI: Charles Schultz is a 39 y.o. male who presents 6 weeks s/p excision of left submandibular gland removal removal of large submandibular stone.  Patient is doing well with no further problems.  Past Medical History:  Diagnosis Date  . Sialadenitis    Past Surgical History:  Procedure Laterality Date  . NO PAST SURGERIES    . SUBMANDIBULAR GLAND EXCISION Left 12/02/2019   Procedure: EXCISION LEFT SUBMANDIBULAR GLAND;  Surgeon: Drema Halon, MD;  Location: [facility];  Service: ENT;  Laterality: Left;   Social History   Socioeconomic History  . Marital status: Married    Spouse name: Not on file  . Number of children: Not on file  . Years of education: Not on file  . Highest education level: Not on file  Occupational History  . Not on file  Tobacco Use  . Smoking status: Never Smoker  . Smokeless tobacco: Never Used  . Tobacco comment: still smoking Marijuana   Substance and Sexual Activity  . Alcohol use: Yes    Comment: occasionally   . Drug use: Yes    Frequency: 3.0 times per week    Types: Marijuana  . Sexual activity: Yes  Other Topics Concern  . Not on file  Social History Narrative  . Not on file   Social Determinants of Health   Financial Resource Strain:   . Difficulty of Paying Living Expenses: Not on file  Food Insecurity:   . Worried About Programme researcher, broadcasting/film/video in the Last Year: Not on file  . Ran Out of Food in the Last Year: Not on file  Transportation Needs:   . Lack of Transportation (Medical): Not on file  . Lack of Transportation (Non-Medical): Not on file  Physical Activity:   . Days of Exercise per Week: Not on file  . Minutes of Exercise per Session: Not on file  Stress:   . Feeling of Stress : Not on file  Social Connections:   . Frequency of Communication with Friends and Family: Not on file  . Frequency of Social Gatherings with Friends and Family: Not on file  . Attends Religious Services: Not on file  . Active  Member of Clubs or Organizations: Not on file  . Attends Banker Meetings: Not on file  . Marital Status: Not on file   Family History  Problem Relation Age of Onset  . Diabetes Father   . Prostate cancer Father    No Known Allergies Prior to Admission medications   Not on File     Physical Exam: Excision site is healing nicely with mild induration but no drainage.  No erythema. Intraoral exam reveals normal left floor mouth.  Palpation of this area was soft with no palpable stones or masses.   Assessment: S/p excision left submandibular gland and large 3 cm stone  Plan: He will follow-up as needed   Narda Bonds, MD

## 2020-04-12 ENCOUNTER — Ambulatory Visit: Payer: PRIVATE HEALTH INSURANCE | Attending: Internal Medicine

## 2020-04-12 DIAGNOSIS — Z23 Encounter for immunization: Secondary | ICD-10-CM

## 2020-04-12 NOTE — Progress Notes (Signed)
   Covid-19 Vaccination Clinic  Name:  LANDYN BUCKALEW    MRN: [id number] DOB: [birth year]  04/12/2020  Mr. Mccaskey was observed post Covid-19 immunization for 15 minutes without incident. He was provided with Vaccine Information Sheet and instruction to access the V-Safe system.   Mr. Skoda was instructed to call 911 with any severe reactions post vaccine: Marland Kitchen Difficulty breathing  . Swelling of face and throat  . A fast heartbeat  . A bad rash all over body  . Dizziness and weakness   Immunizations Administered    Name Date Dose VIS Date Route   Pfizer COVID-19 Vaccine 04/12/2020 10:13 AM 0.3 mL 12/09/2019 Intramuscular   Manufacturer: [employer], [employer]   Lot: [id number]   NDC: [address]-0

## 2020-05-09 ENCOUNTER — Ambulatory Visit: Payer: PRIVATE HEALTH INSURANCE | Attending: Internal Medicine

## 2020-05-21 ENCOUNTER — Ambulatory Visit: Payer: PRIVATE HEALTH INSURANCE | Attending: Internal Medicine

## 2020-05-21 DIAGNOSIS — Z23 Encounter for immunization: Secondary | ICD-10-CM

## 2020-05-21 NOTE — Progress Notes (Signed)
   Covid-19 Vaccination Clinic  Name:  Charles Schultz    MRN: [id number] DOB: 12/15/1981  05/21/2020  [name] was observed post Covid-19 immunization for 15 minutes without incident. He was provided with Vaccine Information Sheet and instruction to access the V-Safe system.   [name] was instructed to call 911 with any severe reactions post vaccine: Marland Kitchen Difficulty breathing  . Swelling of face and throat  . A fast heartbeat  . A bad rash all over body  . Dizziness and weakness   Immunizations Administered    Name Date Dose VIS Date Route   Pfizer COVID-19 Vaccine 05/21/2020  4:46 PM 0.3 mL 02/22/2019 Intramuscular   Manufacturer: [employer], [employer]   Lot: [id number]   NDC: [address]-3

## 2021-11-02 ENCOUNTER — Telehealth: Payer: Self-pay | Admitting: Nurse Practitioner

## 2021-11-02 DIAGNOSIS — R6889 Other general symptoms and signs: Secondary | ICD-10-CM

## 2021-11-02 DIAGNOSIS — J069 Acute upper respiratory infection, unspecified: Secondary | ICD-10-CM

## 2021-11-02 MED ORDER — FLUTICASONE PROPIONATE 50 MCG/ACT NA SUSP: 2.0000 | Freq: Every day | NASAL | 0 refills | Status: DC

## 2021-11-02 MED ORDER — OSELTAMIVIR PHOSPHATE 75 MG PO CAPS: 75.0000 mg | ORAL_CAPSULE | Freq: Two times a day (BID) | ORAL | 0 refills | Status: AC

## 2021-11-02 MED ORDER — PSEUDOEPH-BROMPHEN-DM 30-2-10 MG/5ML PO SYRP: 5.0000 mL | ORAL_SOLUTION | Freq: Four times a day (QID) | ORAL | 0 refills | Status: AC | PRN

## 2021-11-02 NOTE — Patient Instructions (Addendum)
  Charles Schultz, thank you for joining Nicoletta Ba, NP for today's virtual visit.  While this provider is not your primary care provider (PCP), if your PCP is located in our provider database this encounter information will be shared with them immediately following your visit.  Consent: (Patient) Charles Schultz provided verbal consent for this virtual visit at the beginning of the encounter.  Current Medications:  Current Outpatient Medications:    brompheniramine-pseudoephedrine-DM 30-2-10 MG/5ML syrup, Take 5 mLs by mouth 4 (four) times daily as needed for up to 7 days., Disp: 140 mL, Rfl: 0   fluticasone (FLONASE) 50 MCG/ACT nasal spray, Place 2 sprays into both nostrils daily for 7 days., Disp: 16 g, Rfl: 0   oseltamivir (TAMIFLU) 75 MG capsule, Take 1 capsule (75 mg total) by mouth 2 (two) times daily for 5 days., Disp: 10 capsule, Rfl: 0   Medications ordered in this encounter:  Meds ordered this encounter  Medications   oseltamivir (TAMIFLU) 75 MG capsule    Sig: Take 1 capsule (75 mg total) by mouth 2 (two) times daily for 5 days.    Dispense:  10 capsule    Refill:  0   brompheniramine-pseudoephedrine-DM 30-2-10 MG/5ML syrup    Sig: Take 5 mLs by mouth 4 (four) times daily as needed for up to 7 days.    Dispense:  140 mL    Refill:  0   fluticasone (FLONASE) 50 MCG/ACT nasal spray    Sig: Place 2 sprays into both nostrils daily for 7 days.    Dispense:  16 g    Refill:  0     *If you need refills on other medications prior to your next appointment, please contact your pharmacy*  Follow-Up: Call back or seek an in-person evaluation if the symptoms worsen or if the condition fails to improve as anticipated.  Other Instructions Symptoms are consistent with URI, most likely influenza, but cannot rule out COVID-19 without testing. Given recent exposure to influenza, will treat with Tamiflu, and other symptomatic treatment to include Bromfed and Fluticasone. Use OTC  Ibuprofen (take with food and water) to help with bodyaches, fever and HA. Begin a BRAT diet until appetite improves. Increase fluids and get plenty of rest. Work note provided. Patient encouraged to follow-up with PCP or telehealth if symptoms do not improve within the next 3-5 days, would recommend testing for COVID at that time or sooner if available.     If you have been instructed to have an in-person evaluation today at a local Urgent Care facility, please use the link below. It will take you to a list of all of our available [facility], including address, phone number and hours of operation. Please do not delay care.  [facility]  If you or a family member do not have a primary care provider, use the link below to schedule a visit and establish care. When you choose a [facility] primary care physician or advanced practice provider, you gain a long-term partner in health. Find a Primary Care Provider  Learn more about [facility]'s in-office and virtual care options: [facility] - Get Care Now

## 2021-11-02 NOTE — Progress Notes (Signed)
Virtual Visit Consent   Charles Schultz, you are scheduled for a virtual visit with a [facility] provider today.     Just as with appointments in the office, your consent must be obtained to participate.  Your consent will be active for this visit and any virtual visit you may have with one of our providers in the next 365 days.     If you have a MyChart account, a copy of this consent can be sent to you electronically.  All virtual visits are billed to your insurance company just like a traditional visit in the office.    As this is a virtual visit, video technology does not allow for your provider to perform a traditional examination.  This may limit your provider's ability to fully assess your condition.  If your provider identifies any concerns that need to be evaluated in person or the need to arrange testing (such as labs, EKG, etc.), we will make arrangements to do so.     Although advances in technology are sophisticated, we cannot ensure that it will always work on either your end or our end.  If the connection with a video visit is poor, the visit may have to be switched to a telephone visit.  With either a video or telephone visit, we are not always able to ensure that we have a secure connection.     I need to obtain your verbal consent now.   Are you willing to proceed with your visit today? Yes   Charles Schultz has provided verbal consent on 11/02/2021 for a virtual visit (video or telephone).   Nicoletta Ba, NP   Date: 11/02/2021 11:59 AM   Virtual Visit via Video Note   I, Nicoletta Ba, connected with  Charles Schultz  ([id number], 1981-10-27) on 11/02/21 at 12:15 PM EDT by a video-enabled telemedicine application and verified that I am speaking with the correct person using two identifiers.  Location: Patient: Virtual Visit Location Patient: Home Provider: Virtual Visit Location Provider: Home Office   I discussed the limitations of evaluation and management by  telemedicine and the availability of in person appointments. The patient expressed understanding and agreed to proceed.    History of Present Illness: Charles Schultz is a 40 y.o. who identifies as a male who was assigned male at birth, and is being seen today for upper respiratory symptoms. The patient states symptoms started 2 days ago. He complains of fever, chills, bodyaches, HA, nasal congestion, cough, decreased appetitie, nausea and vomiting. HA pain is moderate per patient. Denies sore throat, ear pain, SOB, wheezing, He informs his son was diagnosed with influenza on 10/30/21. He has been taking OTC medications, Alka Seltzer and Tylenol with minimal relief. He has not been tested for COVID.  HPI: HPI  Problems:  Patient Active Problem List   Diagnosis Date Noted   Sialoadenitis 02/24/2018    Allergies: No Known Allergies Medications: No current outpatient medications on file.  Observations/Objective: Patient is well-developed, well-nourished in no acute distress.  Resting comfortably at home.  Head is normocephalic, atraumatic.  No labored breathing.  Speech is clear and coherent with logical content.  Patient is alert and oriented at baseline.    Assessment and Plan: 1. Upper respiratory tract infection, unspecified type  2. Flu-like symptoms  2. Symptoms are consistent with URI, most likely influenza, but cannot rule out COVID-19 without testing. Given his recent exposure to influenza, will treat with Tamiflu, and other symptomatic treatment  to include Bromfed and Fluticasone. Instructed patient to use OTC Ibuprofen (take with food and water) to help with bodyaches, fever and HA. Encouraged patient to begin a BRAT diet until appetite improves. Increase fluids and get plenty of rest. Patient would like work note. Patient encouraged to follow-up with PCP or telehealth if symptoms do not improve within the next 3-5 days, would recommend testing for COVID at that time or sooner if  available.    Patient verbalizes understanding, all questions answered.   Follow Up Instructions: I discussed the assessment and treatment plan with the patient. The patient was provided an opportunity to ask questions and all were answered. The patient agreed with the plan and demonstrated an understanding of the instructions.  A copy of instructions were sent to the patient via MyChart unless otherwise noted below.   The patient was advised to call back or seek an in-person evaluation if the symptoms worsen or if the condition fails to improve as anticipated.  Time:  I spent 10 minutes with the patient via telehealth technology discussing the above problems/concerns.    Nicoletta Ba, NP

## 2021-11-04 ENCOUNTER — Telehealth: Payer: Self-pay | Admitting: Nurse Practitioner

## 2021-11-04 NOTE — Progress Notes (Signed)
Patient ID: Charles Schultz, male   DOB: 09/10/1981, 40 y.o.   MRN: [id number]  Received notification from the pharmacy regarding of tamiflu requesting another prescription. Called patient to inform him of the same. Spoke with patient and he informed there was a note at the pharmacy when he went to pick up the medication. Patient informs he is feeling better and declines a substitute at this time.   Called pharmacy to inform prescription can be canceled, no substitution needed at this time. Spoke with pharmacy staff regarding the same.

## 2021-12-07 ENCOUNTER — Telehealth: Payer: Managed Care, Other (non HMO) | Admitting: Nurse Practitioner

## 2021-12-07 DIAGNOSIS — K649 Unspecified hemorrhoids: Secondary | ICD-10-CM

## 2021-12-07 MED ORDER — HYDROCORTISONE 2.5 % EX CREA: TOPICAL_CREAM | Freq: Two times a day (BID) | CUTANEOUS | 1 refills | Status: DC

## 2021-12-07 MED ORDER — SENNOSIDES-DOCUSATE SODIUM 8.6-50 MG PO TABS: 2.0000 | ORAL_TABLET | Freq: Every day | ORAL | 1 refills | Status: DC

## 2021-12-07 NOTE — Progress Notes (Signed)
Virtual Visit Consent   Charles Schultz, you are scheduled for a virtual visit with a [facility] provider today.     Just as with appointments in the office, your consent must be obtained to participate.  Your consent will be active for this visit and any virtual visit you may have with one of our providers in the next 365 days.     If you have a MyChart account, a copy of this consent can be sent to you electronically.  All virtual visits are billed to your insurance company just like a traditional visit in the office.    As this is a virtual visit, video technology does not allow for your provider to perform a traditional examination.  This may limit your provider's ability to fully assess your condition.  If your provider identifies any concerns that need to be evaluated in person or the need to arrange testing (such as labs, EKG, etc.), we will make arrangements to do so.     Although advances in technology are sophisticated, we cannot ensure that it will always work on either your end or our end.  If the connection with a video visit is poor, the visit may have to be switched to a telephone visit.  With either a video or telephone visit, we are not always able to ensure that we have a secure connection.     I need to obtain your verbal consent now.   Are you willing to proceed with your visit today?    [name] has provided verbal consent on 12/07/2021 for a virtual visit (video or telephone).   Claiborne Rigg, NP   Date: 12/07/2021 4:18 PM   Virtual Visit via Video Note   I, Claiborne Rigg, connected with  Charles Schultz  ([id number], [birth year]) on 12/07/21 at  4:00 PM EST by a video-enabled telemedicine application and verified that I am speaking with the correct person using two identifiers.  Location: Patient: Virtual Visit Location Patient: Home Provider: Virtual Visit Location Provider: Home   I discussed the limitations of evaluation and management by telemedicine  and the availability of in person appointments. The patient expressed understanding and agreed to proceed.    History of Present Illness: Charles Schultz is a 40 y.o. who identifies as a male who was assigned male at birth, and is being seen today for hemorrhoids.  HPI:  Onset one week ago. Notes blood when he wipes with tissue. No blood in toilet. Associated symptoms:constipation and  rectal itching with pain.   Problems:  Patient Active Problem List   Diagnosis Date Noted   Sialoadenitis 02/24/2018    Allergies: No Known Allergies Medications:  Current Outpatient Medications:    hydrocortisone 2.5 % cream, Apply topically 2 (two) times daily., Disp: 60 g, Rfl: 1   senna-docusate (SENOKOT-S) 8.6-50 MG tablet, Take 2 tablets by mouth daily., Disp: 60 tablet, Rfl: 1   fluticasone (FLONASE) 50 MCG/ACT nasal spray, Place 2 sprays into both nostrils daily for 7 days., Disp: 16 g, Rfl: 0  Observations/Objective: Patient is well-developed, well-nourished in no acute distress.  Resting comfortably  at home.  Head is normocephalic, atraumatic.  No labored breathing.  Speech is clear and coherent with logical content.  Patient is alert and oriented at baseline.    Assessment and Plan: 1. Hemorrhoids, unspecified hemorrhoid type - hydrocortisone 2.5 % cream; Apply topically 2 (two) times daily.  Dispense: 60 g; Refill: 1 - senna-docusate (SENOKOT-S) 8.6-50  MG tablet; Take 2 tablets by mouth daily.  Dispense: 60 tablet; Refill: 1 Drink at least 80 oz of water daily to help with stool consistency  Follow Up Instructions: I discussed the assessment and treatment plan with the patient. The patient was provided an opportunity to ask questions and all were answered. The patient agreed with the plan and demonstrated an understanding of the instructions.  A copy of instructions were sent to the patient via MyChart unless otherwise noted below.     The patient was advised to call back or seek an  in-person evaluation if the symptoms worsen or if the condition fails to improve as anticipated.  Time:  I spent 10 minutes with the patient via telehealth technology discussing the above problems/concerns.    Gildardo Pounds, NP

## 2021-12-07 NOTE — Patient Instructions (Signed)
   We are sorry that you are not feeling well. We are here to help!  Hemorrhoids are swollen veins in the rectum. They can cause itching, bleeding, and pain. Hemorrhoids are very common.  In some cases, you can see or feel hemorrhoids around the outside of the rectum. In other cases, you cannot see them because they are hidden inside the rectum. Be patient - It can take months for this to improve or go away.   Hemorrhoids do not always cause symptoms. But when they do, symptoms can include: ?Itching of the skin around the anus ?Bleeding - Bleeding is usually painless. You might see bright red blood after using the toilet. ?Pain - If a blood clot forms inside a hemorrhoid, this can cause pain. It can also cause a lump that you might be able to feel.   What can I do to keep from getting more hemorrhoids? -- The most important thing you can do is to keep from getting constipated. You should have a bowel movement at least a few times a week. When you have a bowel movement, you also should not have to push too much. Plus, your bowel movements should not be too hard. Being constipated and having hard bowel movements can make hemorrhoids worse.   I have prescribed Topical Hydrocortisone ointment 2.5%.  Apply to area two times per day for 30 days  HOME CARE: Sitz Baths twice daily. Soak buttocks in 2 or 3 inches of warm water for 10 to 15 minutes. Do not add soap, bubble bath, or anything to the water. Stool softener such as Colace 100 mg twice daily AND Miralax 1 scoop daily until you have regular soft stools Over the counter Preparation H Tucks Pads Witch Hazel  Here are some steps you can take to avoid getting constipated or having hard stools:  ?Eat lots of fruits, vegetables, and other foods with fiber. Fiber helps to increase bowel movements. If you do not get enough fiber from your diet, you can take fiber supplements. These come in the form of powders, wafers, or pills. Some examples are  Metamucil, Citrucel, Benefiber and FiberCon. If you take a fiber supplement, be sure to read the label so you know how much to take. If you're not sure, ask your provider or nurse. ?Take medicines called "stool softeners" such as docusate sodium (sample brand names: Colace, Dulcolax). These medicines increase the number of bowel movements you have. They are safe to take and they can prevent problems later.  You should request a referral for a follow up evaluation with a Gastroenterologist (GI doctor) to evaluate this chronic and relapsing condition - even if it improves to see what further steps need to be taken. This is highly linked to chronic constipation and straining to have a bowel movement. It may require further treatment or surgical intervention.   GET HELP RIGHT AWAY IF: You develop severe pain You have heavy bleeding   FOLLOW UP WITH YOUR PRIMARY PROVIDER IF: If your symptoms do not improve within 10 days  MAKE SURE YOU  Understand these instructions. Will watch your condition. Will get help right away if you are not doing well or get worse.

## 2022-01-10 ENCOUNTER — Telehealth: Payer: Managed Care, Other (non HMO) | Admitting: Nurse Practitioner

## 2022-01-10 DIAGNOSIS — K649 Unspecified hemorrhoids: Secondary | ICD-10-CM

## 2022-01-10 MED ORDER — HYDROCORTISONE ACETATE 25 MG RE SUPP: 25.0000 mg | Freq: Two times a day (BID) | RECTAL | 0 refills | Status: DC

## 2022-01-10 NOTE — Patient Instructions (Signed)
Hemorrhoids °Hemorrhoids are swollen veins that may develop: °In the butt (rectum). These are called internal hemorrhoids. °Around the opening of the butt (anus). These are called external hemorrhoids. °Hemorrhoids can cause pain, itching, or bleeding. Most of the time, they do not cause serious problems. They usually get better with diet changes, lifestyle changes, and other home treatments. °What are the causes? °This condition may be caused by: °Having trouble pooping (constipation). °Pushing hard (straining) to poop. °Watery poop (diarrhea). °Pregnancy. °Being very overweight (obese). °Sitting for long periods of time. °Heavy lifting or other activity that causes you to strain. °Anal sex. °Riding a bike for a long period of time. °What are the signs or symptoms? °Symptoms of this condition include: °Pain. °Itching or soreness in the butt. °Bleeding from the butt. °Leaking poop. °Swelling in the area. °One or more lumps around the opening of your butt. °How is this diagnosed? °A doctor can often diagnose this condition by looking at the affected area. The doctor may also: °Do an exam that involves feeling the area with a gloved hand (digital rectal exam). °Examine the area inside your butt using a small tube (anoscope). °Order blood tests. This may be done if you have lost a lot of blood. °Have you get a test that involves looking inside the colon using a flexible tube with a camera on the end (sigmoidoscopy or colonoscopy). °How is this treated? °This condition can usually be treated at home. Your doctor may tell you to change what you eat, make lifestyle changes, or try home treatments. If these do not help, procedures can be done to remove the hemorrhoids or make them smaller. These may involve: °Placing rubber bands at the base of the hemorrhoids to cut off their blood supply. °Injecting medicine into the hemorrhoids to shrink them. °Shining a type of light energy onto the hemorrhoids to cause them to fall  off. °Doing surgery to remove the hemorrhoids or cut off their blood supply. °Follow these instructions at home: °Eating and drinking ° °Eat foods that have a lot of fiber in them. These include whole grains, beans, nuts, fruits, and vegetables. °Ask your doctor about taking products that have added fiber (fibersupplements). °Reduce the amount of fat in your diet. You can do this by: °Eating low-fat dairy products. °Eating less red meat. °Avoiding processed foods. °Drink enough fluid to keep your pee (urine) pale yellow. °Managing pain and swelling ° °Take a warm-water bath (sitz bath) for 20 minutes to ease pain. Do this 3-4 times a day. You may do this in a bathtub or using a portable sitz bath that fits over the toilet. °If told, put ice on the painful area. It may be helpful to use ice between your warm baths. °Put ice in a plastic bag. °Place a towel between your skin and the bag. °Leave the ice on for 20 minutes, 2-3 times a day. °General instructions °Take over-the-counter and prescription medicines only as told by your doctor. °Medicated creams and medicines may be used as told. °Exercise often. Ask your doctor how much and what kind of exercise is best for you. °Go to the bathroom when you have the urge to poop. Do not wait. °Avoid pushing too hard when you poop. °Keep your butt dry and clean. Use wet toilet paper or moist towelettes after pooping. °Do not sit on the toilet for a long time. °Keep all follow-up visits as told by your doctor. This is important. °Contact a doctor if you: °Have pain and   swelling that do not get better with treatment or medicine. °Have trouble pooping. °Cannot poop. °Have pain or swelling outside the area of the hemorrhoids. °Get help right away if you have: °Bleeding that will not stop. °Summary °Hemorrhoids are swollen veins in the butt or around the opening of the butt. °They can cause pain, itching, or bleeding. °Eat foods that have a lot of fiber in them. These include  whole grains, beans, nuts, fruits, and vegetables. °Take a warm-water bath (sitz bath) for 20 minutes to ease pain. Do this 3-4 times a day. °This information is not intended to replace advice given to you by your health care provider. Make sure you discuss any questions you have with your health care provider. °Document Revised: 06/26/2021 Document Reviewed: 06/26/2021 °Elsevier Patient Education © 2022 [employer] ° °

## 2022-01-10 NOTE — Progress Notes (Signed)
Virtual Visit Consent   [name], you are scheduled for a virtual visit with Mary-Margaret Hassell Done, [place], a [facility] provider, today.     Just as with appointments in the office, your consent must be obtained to participate.  Your consent will be active for this visit and any virtual visit you may have with one of our providers in the next 365 days.     If you have a MyChart account, a copy of this consent can be sent to you electronically.  All virtual visits are billed to your insurance company just like a traditional visit in the office.    As this is a virtual visit, video technology does not allow for your provider to perform a traditional examination.  This may limit your provider's ability to fully assess your condition.  If your provider identifies any concerns that need to be evaluated in person or the need to arrange testing (such as labs, EKG, etc.), we will make arrangements to do so.     Although advances in technology are sophisticated, we cannot ensure that it will always work on either your end or our end.  If the connection with a video visit is poor, the visit may have to be switched to a telephone visit.  With either a video or telephone visit, we are not always able to ensure that we have a secure connection.     I need to obtain your verbal consent now.   Are you willing to proceed with your visit today? YES   [name] has provided verbal consent on 01/10/2022 for a virtual visit (video or telephone).   Mary-Margaret Hassell Done, FNP   Date: 01/10/2022 4:41 PM   Virtual Visit via Video Note   I, Mary-Margaret Hassell Done, connected with COE SERIGHT ([id number], 1981/08/21) on 01/10/22 at  5:00 PM EST by a video-enabled telemedicine application and verified that I am speaking with the correct person using two identifiers.  Location: Patient: Virtual Visit Location Patient: Home Provider: Virtual Visit Location Provider: Mobile   I discussed the limitations of  evaluation and management by telemedicine and the availability of in person appointments. The patient expressed understanding and agreed to proceed.    History of Present Illness: Charles Schultz is a 41 y.o. who identifies as a male who was assigned male at birth, and is being seen today for blood in stool.  HPI: Patient says that he got up this morning to use the restroom and had blood in stool. He no has pain, burning and itching around anal area.    Review of Systems  Cardiovascular: Negative.   Genitourinary: Negative.   All other systems reviewed and are negative.  Problems:  Patient Active Problem List   Diagnosis Date Noted   Sialoadenitis 02/24/2018    Allergies: No Known Allergies Medications:  Current Outpatient Medications:    fluticasone (FLONASE) 50 MCG/ACT nasal spray, Place 2 sprays into both nostrils daily for 7 days., Disp: 16 g, Rfl: 0   hydrocortisone 2.5 % cream, Apply topically 2 (two) times daily., Disp: 60 g, Rfl: 1   senna-docusate (SENOKOT-S) 8.6-50 MG tablet, Take 2 tablets by mouth daily., Disp: 60 tablet, Rfl: 1  Observations/Objective: Patient is well-developed, well-nourished in no acute distress.  Resting comfortably  at home.  Head is normocephalic, atraumatic.  No labored breathing.  Speech is clear and coherent with logical content.  Patient is alert and oriented at baseline.    Assessment and Plan:  Verne Carrow in today with chief complaint of Blood In Stools   1. Hemorrhoids, unspecified hemorrhoid type Increase fiber in diet Force fluids Stool softners as needed Meds ordered this encounter  Medications   hydrocortisone (ANUSOL-HC) 25 MG suppository    Sig: Place 1 suppository (25 mg total) rectally 2 (two) times daily.    Dispense:  12 suppository    Refill:  0    Order Specific Question:   Supervising Provider    Answer:   Noemi Chapel [[id number]]       Follow Up Instructions: I discussed the assessment and treatment plan  with the patient. The patient was provided an opportunity to ask questions and all were answered. The patient agreed with the plan and demonstrated an understanding of the instructions.  A copy of instructions were sent to the patient via MyChart.  The patient was advised to call back or seek an in-person evaluation if the symptoms worsen or if the condition fails to improve as anticipated.  Time:  I spent 8 minutes with the patient via telehealth technology discussing the above problems/concerns.    Mary-Margaret Hassell Done, FNP

## 2022-01-12 ENCOUNTER — Telehealth: Payer: Managed Care, Other (non HMO) | Admitting: Nurse Practitioner

## 2022-01-12 DIAGNOSIS — K649 Unspecified hemorrhoids: Secondary | ICD-10-CM | POA: Diagnosis not present

## 2022-01-12 NOTE — Patient Instructions (Signed)
°  Hortencia Conradi, thank you for joining Nicoletta Ba, NP for today's virtual visit.  While this provider is not your primary care provider (PCP), if your PCP is located in our provider database this encounter information will be shared with them immediately following your visit.  Consent: (Patient) Charles Schultz provided verbal consent for this virtual visit at the beginning of the encounter.  Current Medications:  Current Outpatient Medications:    fluticasone (FLONASE) 50 MCG/ACT nasal spray, Place 2 sprays into both nostrils daily for 7 days., Disp: 16 g, Rfl: 0   hydrocortisone (ANUSOL-HC) 25 MG suppository, Place 1 suppository (25 mg total) rectally 2 (two) times daily., Disp: 12 suppository, Rfl: 0   hydrocortisone 2.5 % cream, Apply topically 2 (two) times daily., Disp: 60 g, Rfl: 1   senna-docusate (SENOKOT-S) 8.6-50 MG tablet, Take 2 tablets by mouth daily., Disp: 60 tablet, Rfl: 1   Medications ordered in this encounter:  No orders of the defined types were placed in this encounter.    *If you need refills on other medications prior to your next appointment, please contact your pharmacy*  Follow-Up: Call back or seek an in-person evaluation if the symptoms worsen or if the condition fails to improve as anticipated.  Other Instructions Recommend starting prescription, Epsom salt soaks, and may continue other medications as needed. Increase fluids, modify diet and continue stool softener while symptoms persist and thereafter. Keep appointment with PCP.   If you have been instructed to have an in-person evaluation today at a local Urgent Care facility, please use the link below. It will take you to a list of all of our available [facility], including address, phone number and hours of operation. Please do not delay care.  [facility]  If you or a family member do not have a primary care provider, use the link below to schedule a visit and establish  care. When you choose a [facility] primary care physician or advanced practice provider, you gain a long-term partner in health. Find a Primary Care Provider  Learn more about [facility]'s in-office and virtual care options: [facility] Get Care Now

## 2022-01-12 NOTE — Progress Notes (Signed)
Virtual Visit Consent   Charles Schultz, you are scheduled for a virtual visit with a [facility] provider today.     Just as with appointments in the office, your consent must be obtained to participate.  Your consent will be active for this visit and any virtual visit you may have with one of our providers in the next 365 days.     If you have a MyChart account, a copy of this consent can be sent to you electronically.  All virtual visits are billed to your insurance company just like a traditional visit in the office.    As this is a virtual visit, video technology does not allow for your provider to perform a traditional examination.  This may limit your provider's ability to fully assess your condition.  If your provider identifies any concerns that need to be evaluated in person or the need to arrange testing (such as labs, EKG, etc.), we will make arrangements to do so.     Although advances in technology are sophisticated, we cannot ensure that it will always work on either your end or our end.  If the connection with a video visit is poor, the visit may have to be switched to a telephone visit.  With either a video or telephone visit, we are not always able to ensure that we have a secure connection.     I need to obtain your verbal consent now.   Are you willing to proceed with your visit today? Yes   Charles Schultz has provided verbal consent on 01/12/2022 for a virtual visit (video or telephone).   Reynolds Bowl, NP   Date: 01/12/2022 4:53 PM   Virtual Visit via Video Note   I, Reynolds Bowl, connected with  Charles Schultz  ([id number], [birth year]) on 01/12/22 at  5:00 PM EST by a video-enabled telemedicine application and verified that I am speaking with the correct person using two identifiers.  Location: Patient: Virtual Visit Location Patient: Home Provider: Virtual Visit Location Provider: Home   I discussed the limitations of evaluation and management by telemedicine and  the availability of in person appointments. The patient expressed understanding and agreed to proceed.    History of Present Illness: Charles Schultz is a 41 y.o. who identifies as a male who was assigned male at birth, and is being seen today for continued rectal pain. He reports he completed a visit on 1/13 and was prescribed hydrocortisone cream, senna, and has been using preparation H. He continues to experience rectal pain, pain with sitting and bowel movements. Reports swelling has improved over the past 2 days. He reports he was also prescribed Anusol, but medication was not available; he has since been notified that medication is ready for pickup. He is scheduled to see his PCP on 1/30.  HPI: HPI  Problems:  Patient Active Problem List   Diagnosis Date Noted   Sialoadenitis 02/24/2018    Allergies: No Known Allergies Medications:  Current Outpatient Medications:    fluticasone (FLONASE) 50 MCG/ACT nasal spray, Place 2 sprays into both nostrils daily for 7 days., Disp: 16 g, Rfl: 0   hydrocortisone (ANUSOL-HC) 25 MG suppository, Place 1 suppository (25 mg total) rectally 2 (two) times daily., Disp: 12 suppository, Rfl: 0   hydrocortisone 2.5 % cream, Apply topically 2 (two) times daily., Disp: 60 g, Rfl: 1   senna-docusate (SENOKOT-S) 8.6-50 MG tablet, Take 2 tablets by mouth daily., Disp: 60 tablet, Rfl: 1  Observations/Objective:  Patient is well-developed, well-nourished in no acute distress.  Resting comfortably at home.  Head is normocephalic, atraumatic.  No labored breathing. Speech is clear and coherent with logical content.  Patient is alert and oriented at baseline.    Assessment and Plan: 1. Hemorrhoids, unspecified hemorrhoid type  -Patient was prescribed Anusol suppositories, but it was not available. He reports he was notified by the pharmacy that medication was not available, but he has since been notified that medication is ready for pickup. Recommend starting  prescription, Epsom salt soaks, and may continue other medications as needed. Increase fluids, modify diet and continue stool softener while symptoms persist and thereafter. Keep appointment with PCP.  Follow Up Instructions: I discussed the assessment and treatment plan with the patient. The patient was provided an opportunity to ask questions and all were answered. The patient agreed with the plan and demonstrated an understanding of the instructions.  A copy of instructions were sent to the patient via MyChart unless otherwise noted below.     The patient was advised to call back or seek an in-person evaluation if the symptoms worsen or if the condition fails to improve as anticipated.  Time:  I spent 10 minutes with the patient via telehealth technology discussing the above problems/concerns.    Reynolds Bowl, NP

## 2022-02-17 ENCOUNTER — Encounter: Payer: Managed Care, Other (non HMO) | Admitting: Nurse Practitioner

## 2022-06-23 ENCOUNTER — Ambulatory Visit: Payer: Managed Care, Other (non HMO) | Attending: Nurse Practitioner | Admitting: Nurse Practitioner

## 2022-06-23 ENCOUNTER — Encounter: Payer: Self-pay | Admitting: Nurse Practitioner

## 2022-06-23 VITALS — BP 101/65 | HR 55 | Temp 98.3°F | Ht 67.0 in | Wt 131.6 lb

## 2022-06-23 DIAGNOSIS — Z1159 Encounter for screening for other viral diseases: Secondary | ICD-10-CM | POA: Diagnosis not present

## 2022-06-23 DIAGNOSIS — Z1211 Encounter for screening for malignant neoplasm of colon: Secondary | ICD-10-CM | POA: Diagnosis not present

## 2022-06-23 DIAGNOSIS — Z302 Encounter for sterilization: Secondary | ICD-10-CM

## 2022-06-23 DIAGNOSIS — Z0000 Encounter for general adult medical examination without abnormal findings: Secondary | ICD-10-CM

## 2022-06-23 NOTE — Progress Notes (Signed)
Assessment & Plan:  Charles Schultz was seen today for annual exam.  Diagnoses and all orders for this visit:  Encounter for annual physical exam -     CMP14+EGFR -     CBC with Differential -     Lipid panel  Need for hepatitis C screening test -     HCV Ab w Reflex to Quant PCR -     Interpretation:  Colon cancer screening -     Fecal occult blood, imunochemical([facility]/Sun[facility])  Encounter for vasectomy -     Ambulatory referral to Urology    Patient has been counseled on age-appropriate routine health concerns for screening and prevention. These are reviewed and up-to-date. Referrals have been placed accordingly. Immunizations are up-to-date or declined.    Subjective:   Chief Complaint  Patient presents with   Annual Exam   HPI Charles Schultz 41 y.o. male presents to office today for annual physical. He is requesting referral to Urology for vasectomy evaluation.   He has a past medical history of Sialadenitis.   Review of Systems  Constitutional:  Negative for fever, malaise/fatigue and weight loss.  HENT: Negative.  Negative for nosebleeds.   Eyes: Negative.  Negative for blurred vision, double vision and photophobia.  Respiratory: Negative.  Negative for cough and shortness of breath.   Cardiovascular: Negative.  Negative for chest pain, palpitations and leg swelling.  Gastrointestinal: Negative.  Negative for heartburn, nausea and vomiting.  Genitourinary: Negative.   Musculoskeletal: Negative.  Negative for myalgias.  Skin: Negative.   Neurological: Negative.  Negative for dizziness, focal weakness, seizures and headaches.  Endo/Heme/Allergies: Negative.   Psychiatric/Behavioral: Negative.  Negative for suicidal ideas.     Past Medical History:  Diagnosis Date   Sialadenitis     Past Surgical History:  Procedure Laterality Date   NO PAST SURGERIES     SUBMANDIBULAR GLAND EXCISION Left 12/02/2019   Procedure: EXCISION LEFT SUBMANDIBULAR GLAND;  Surgeon:  Rozetta Nunnery, MD;  Location: [place] [facility];  Service: ENT;  Laterality: Left;    Family History  Problem Relation Age of Onset   Diabetes Father    Prostate cancer Father     Social History Reviewed with no changes to be made today.   Outpatient Medications Prior to Visit  Medication Sig Dispense Refill   hydrocortisone (ANUSOL-HC) 25 MG suppository Place 1 suppository (25 mg total) rectally 2 (two) times daily. 12 suppository 0   hydrocortisone 2.5 % cream Apply topically 2 (two) times daily. 60 g 1   senna-docusate (SENOKOT-S) 8.6-50 MG tablet Take 2 tablets by mouth daily. 60 tablet 1   fluticasone (FLONASE) 50 MCG/ACT nasal spray Place 2 sprays into both nostrils daily for 7 days. 16 g 0   No facility-administered medications prior to visit.    No Known Allergies     Objective:    BP 101/65   Pulse (!) 55   Temp 98.3 F (36.8 C) (Oral)   Ht 5' 7"  (1.702 m)   Wt 131 lb 9.6 oz (59.7 kg)   SpO2 99%   BMI 20.61 kg/m  Wt Readings from Last 3 Encounters:  06/23/22 131 lb 9.6 oz (59.7 kg)  12/02/19 152 lb 12.5 oz (69.3 kg)  10/10/19 148 lb (67.1 kg)    Physical Exam Constitutional:      Appearance: He is well-developed.  HENT:     Head: Normocephalic and atraumatic.     Right Ear: Hearing, tympanic membrane, ear canal and  external ear normal.     Left Ear: Hearing, tympanic membrane, ear canal and external ear normal.     Nose: Nose normal. No mucosal edema or rhinorrhea.     Right Turbinates: Not enlarged.     Left Turbinates: Not enlarged.     Mouth/Throat:     Lips: Pink.     Mouth: Mucous membranes are moist.     Dentition: No gingival swelling, dental abscesses or gum lesions.     Pharynx: Uvula midline.     Tonsils: No tonsillar exudate. 1+ on the right. 1+ on the left.  Eyes:     General: Lids are normal. No scleral icterus.    Extraocular Movements: Extraocular movements intact.     Conjunctiva/sclera: Conjunctivae normal.      Pupils: Pupils are equal, round, and reactive to light.  Neck:     Thyroid: No thyromegaly.     Trachea: No tracheal deviation.  Cardiovascular:     Rate and Rhythm: Normal rate and regular rhythm.     Heart sounds: Normal heart sounds. No murmur heard.    No friction rub. No gallop.  Pulmonary:     Effort: Pulmonary effort is normal. No respiratory distress.     Breath sounds: Normal breath sounds. No wheezing or rales.  Chest:     Chest wall: No mass or tenderness.  Breasts:    Right: No inverted nipple, mass, nipple discharge, skin change or tenderness.     Left: No inverted nipple, mass, nipple discharge, skin change or tenderness.  Abdominal:     General: Bowel sounds are normal. There is no distension.     Palpations: Abdomen is soft. There is no mass.     Tenderness: There is no abdominal tenderness. There is no guarding or rebound.  Musculoskeletal:        General: No tenderness or deformity. Normal range of motion.     Cervical back: Normal range of motion and neck supple.  Lymphadenopathy:     Cervical: No cervical adenopathy.  Skin:    General: Skin is warm and dry.     Capillary Refill: Capillary refill takes less than 2 seconds.     Findings: No erythema.  Neurological:     Mental Status: He is alert and oriented to person, place, and time.     Cranial Nerves: No cranial nerve deficit.     Sensory: Sensation is intact.     Motor: No abnormal muscle tone.     Coordination: Coordination is intact. Coordination normal.     Gait: Gait is intact.     Deep Tendon Reflexes: Reflexes normal.     Reflex Scores:      Patellar reflexes are 1+ on the right side and 1+ on the left side. Psychiatric:        Attention and Perception: Attention normal.        Mood and Affect: Mood normal.        Speech: Speech normal.        Behavior: Behavior normal.        Thought Content: Thought content normal.        Judgment: Judgment normal.          Patient has been counseled  extensively about nutrition and exercise as well as the importance of adherence with medications and regular follow-up. The patient was given clear instructions to go to ER or return to medical center if symptoms don't improve, worsen or new problems develop. The  patient verbalized understanding.   Follow-up: Return if symptoms worsen or fail to improve.   Gildardo Pounds, FNP-BC [facility] [place], [place]   07/01/2022, 8:55 AM

## 2022-06-24 LAB — CMP14+EGFR
ALT: 12 IU/L (ref 0–44)
AST: 15 IU/L (ref 0–40)
Albumin/Globulin Ratio: 1.6 (ref 1.2–2.2)
Albumin: 4.6 g/dL (ref 4.0–5.0)
Alkaline Phosphatase: 43 IU/L — ABNORMAL LOW (ref 44–121)
BUN/Creatinine Ratio: 6 — ABNORMAL LOW (ref 9–20)
BUN: 8 mg/dL (ref 6–24)
Bilirubin Total: 0.3 mg/dL (ref 0.0–1.2)
CO2: 25 mmol/L (ref 20–29)
Calcium: 9.5 mg/dL (ref 8.7–10.2)
Chloride: 107 mmol/L — ABNORMAL HIGH (ref 96–106)
Creatinine, Ser: 1.25 mg/dL (ref 0.76–1.27)
Globulin, Total: 2.8 g/dL (ref 1.5–4.5)
Glucose: 81 mg/dL (ref 70–99)
Potassium: 4.4 mmol/L (ref 3.5–5.2)
Sodium: 145 mmol/L — ABNORMAL HIGH (ref 134–144)
Total Protein: 7.4 g/dL (ref 6.0–8.5)
eGFR: 74 mL/min/{1.73_m2} (ref >59)

## 2022-06-24 LAB — CBC WITH DIFFERENTIAL/PLATELET
Basophils Absolute: 0.1 10*3/uL (ref 0.0–0.2)
Basos: 1 %
EOS (ABSOLUTE): 0.1 10*3/uL (ref 0.0–0.4)
Eos: 2 %
Hematocrit: 41.5 % (ref 37.5–51.0)
Hemoglobin: 14.2 g/dL (ref 13.0–17.7)
Immature Grans (Abs): 0 10*3/uL (ref 0.0–0.1)
Immature Granulocytes: 0 %
Lymphocytes Absolute: 2.3 10*3/uL (ref 0.7–3.1)
Lymphs: 46 %
MCH: 31.4 pg (ref 26.6–33.0)
MCHC: 34.2 g/dL (ref 31.5–35.7)
MCV: 92 fL (ref 79–97)
Monocytes Absolute: 0.6 10*3/uL (ref 0.1–0.9)
Monocytes: 13 %
Neutrophils Absolute: 1.9 10*3/uL (ref 1.4–7.0)
Neutrophils: 38 %
Platelets: 215 10*3/uL (ref 150–450)
RBC: 4.52 x10E6/uL (ref 4.14–5.80)
RDW: 13.1 % (ref 11.6–15.4)
WBC: 4.9 10*3/uL (ref 3.4–10.8)

## 2022-06-24 LAB — LIPID PANEL
Chol/HDL Ratio: 3.3 ratio (ref 0.0–5.0)
Cholesterol, Total: 192 mg/dL (ref 100–199)
HDL: 58 mg/dL (ref >39)
LDL Chol Calc (NIH): 120 mg/dL — ABNORMAL HIGH (ref 0–99)
Triglycerides: 76 mg/dL (ref 0–149)
VLDL Cholesterol Cal: 14 mg/dL (ref 5–40)

## 2022-06-24 LAB — HCV INTERPRETATION

## 2022-06-24 LAB — HCV AB W REFLEX TO QUANT PCR: HCV Ab: NONREACTIVE

## 2022-07-01 ENCOUNTER — Encounter: Payer: Self-pay | Admitting: Nurse Practitioner

## 2023-04-11 ENCOUNTER — Telehealth: Payer: Self-pay | Admitting: Nurse Practitioner

## 2023-04-11 DIAGNOSIS — K0889 Other specified disorders of teeth and supporting structures: Secondary | ICD-10-CM

## 2023-04-11 MED ORDER — NAPROXEN 500 MG PO TABS: 500.0000 mg | ORAL_TABLET | Freq: Two times a day (BID) | ORAL | 0 refills | Status: DC

## 2023-04-11 NOTE — Patient Instructions (Signed)
  Hortencia Conradi, thank you for joining Bennie Pierini, FNP for today's virtual visit.  While this provider is not your primary care provider (PCP), if your PCP is located in our provider database this encounter information will be shared with them immediately following your visit.   A [facility] MyChart account gives you access to today's visit and all your visits, tests, and labs performed at [facility] " click here if you don't have a [facility] MyChart account or go to [email or web address]  Consent: (Patient) Charles Schultz provided verbal consent for this virtual visit at the beginning of the encounter.  Current Medications:  Current Outpatient Medications:    naproxen (NAPROSYN) 500 MG tablet, Take 1 tablet (500 mg total) by mouth 2 (two) times daily with a meal., Disp: 40 tablet, Rfl: 0   fluticasone (FLONASE) 50 MCG/ACT nasal spray, Place 2 sprays into both nostrils daily for 7 days., Disp: 16 g, Rfl: 0   hydrocortisone (ANUSOL-HC) 25 MG suppository, Place 1 suppository (25 mg total) rectally 2 (two) times daily., Disp: 12 suppository, Rfl: 0   hydrocortisone 2.5 % cream, Apply topically 2 (two) times daily., Disp: 60 g, Rfl: 1   senna-docusate (SENOKOT-S) 8.6-50 MG tablet, Take 2 tablets by mouth daily., Disp: 60 tablet, Rfl: 1   Medications ordered in this encounter:  Meds ordered this encounter  Medications   naproxen (NAPROSYN) 500 MG tablet    Sig: Take 1 tablet (500 mg total) by mouth 2 (two) times daily with a meal.    Dispense:  40 tablet    Refill:  0    Order Specific Question:   Supervising Provider    Answer:   Merrilee Jansky [id number]     *If you need refills on other medications prior to your next appointment, please contact your pharmacy*  Follow-Up: Call back or seek an in-person evaluation if the symptoms worsen or if the condition fails to improve as anticipated.  [facility] [phone]  Other  Instructions Contact oral surgeon or dentist   If you have been instructed to have an in-person evaluation today at a local Urgent Care facility, please use the link below. It will take you to a list of all of our available [facility], including address, phone number and hours of operation. Please do not delay care.  [facility]  If you or a family member do not have a primary care provider, use the link below to schedule a visit and establish care. When you choose a [facility] primary care physician or advanced practice provider, you gain a long-term partner in health. Find a Primary Care Provider  Learn more about [facility]'s in-office and virtual care options: [facility] - Get Care Now

## 2023-04-11 NOTE — Progress Notes (Signed)
Virtual Visit Consent   Charles Schultz, you are scheduled for a virtual visit with Mary-Margaret Daphine Deutscher, FNP, a [facility] provider, today.     Just as with appointments in the office, your consent must be obtained to participate.  Your consent will be active for this visit and any virtual visit you may have with one of our providers in the next 365 days.     If you have a MyChart account, a copy of this consent can be sent to you electronically.  All virtual visits are billed to your insurance company just like a traditional visit in the office.    As this is a virtual visit, video technology does not allow for your provider to perform a traditional examination.  This may limit your provider's ability to fully assess your condition.  If your provider identifies any concerns that need to be evaluated in person or the need to arrange testing (such as labs, EKG, etc.), we will make arrangements to do so.     Although advances in technology are sophisticated, we cannot ensure that it will always work on either your end or our end.  If the connection with a video visit is poor, the visit may have to be switched to a telephone visit.  With either a video or telephone visit, we are not always able to ensure that we have a secure connection.     I need to obtain your verbal consent now.   Are you willing to proceed with your visit today? YES   Charles Schultz has provided verbal consent on 04/11/2023 for a virtual visit (video or telephone).   Mary-Margaret Daphine Deutscher, FNP   Date: 04/11/2023 6:50 PM   Virtual Visit via Video Note   I, Mary-Margaret Daphine Deutscher, connected with Charles Schultz, Charles Schultz, 1982) on 04/11/23 at  7:00 PM EDT by a video-enabled telemedicine application and verified that I am speaking with the correct person using two identifiers.  Location: Patient: Virtual Visit Location Patient: Home Provider: Virtual Visit Location Provider: Mobile   I discussed the limitations of  evaluation and management by telemedicine and the availability of in person appointments. The patient expressed understanding and agreed to proceed.    History of Present Illness: Charles Schultz is a 42 y.o. who identifies as a male who was assigned male at birth, and is being seen today dental pain  .  HPI: Patient had teeth pulled on Wednesday. Still having a lot of pain. Rats pain 8/-9/10. Hurts to close mouth and talk. Not able to eat anything.     ROS  Problems:  Patient Active Problem List   Diagnosis Date Noted   Sialoadenitis 02/24/2018    Allergies: No Known Allergies Medications:  Current Outpatient Medications:    fluticasone (FLONASE) 50 MCG/ACT nasal spray, Place 2 sprays into both nostrils daily for 7 days., Disp: 16 g, Rfl: 0   hydrocortisone (ANUSOL-HC) 25 MG suppository, Place 1 suppository (25 mg total) rectally 2 (two) times daily., Disp: 12 suppository, Rfl: 0   hydrocortisone 2.5 % cream, Apply topically 2 (two) times daily., Disp: 60 g, Rfl: 1   senna-docusate (SENOKOT-S) 8.6-50 MG tablet, Take 2 tablets by mouth daily., Disp: 60 tablet, Rfl: 1  Observations/Objective: Patient is well-developed, well-nourished in no acute distress.  Resting comfortably  at home.  Head is normocephalic, atraumatic.  No labored breathing.  Speech is clear and coherent with logical content.  Patient is alert and oriented at baseline.  Patient in drk room and could not see cheecks well. -   Assessment and Plan:  Charles Schultz in today with chief complaint of No chief complaint on file.   1. Pain, dental Ice Try to contact dentist that did extraction  Meds ordered this encounter  Medications   naproxen (NAPROSYN) 500 MG tablet    Sig: Take 1 tablet (500 mg total) by mouth 2 (two) times daily with a meal.    Dispense:  40 tablet    Refill:  0    Order Specific Question:   Supervising Provider    Answer:   Merrilee Jansky [id number]      Follow Up  Instructions: I discussed the assessment and treatment plan with the patient. The patient was provided an opportunity to ask questions and all were answered. The patient agreed with the plan and demonstrated an understanding of the instructions.  A copy of instructions were sent to the patient via MyChart.  The patient was advised to call back or seek an in-person evaluation if the symptoms worsen or if the condition fails to improve as anticipated.  Time:  I spent 8 minutes with the patient via telehealth technology discussing the above problems/concerns.    Mary-Margaret Daphine Deutscher, FNP

## 2023-06-06 ENCOUNTER — Telehealth: Payer: Self-pay

## 2023-06-06 ENCOUNTER — Telehealth: Payer: Self-pay | Admitting: Nurse Practitioner

## 2023-06-06 DIAGNOSIS — M545 Low back pain, unspecified: Secondary | ICD-10-CM

## 2023-06-06 MED ORDER — CYCLOBENZAPRINE HCL 10 MG PO TABS: 10.0000 mg | ORAL_TABLET | Freq: Three times a day (TID) | ORAL | 1 refills | Status: DC | PRN

## 2023-06-06 MED ORDER — NAPROXEN 500 MG PO TABS: 500.0000 mg | ORAL_TABLET | Freq: Two times a day (BID) | ORAL | 1 refills | Status: DC

## 2023-06-06 NOTE — Progress Notes (Signed)

## 2023-08-14 ENCOUNTER — Telehealth: Payer: Commercial Managed Care - PPO | Admitting: Nurse Practitioner

## 2023-08-14 ENCOUNTER — Encounter (HOSPITAL_COMMUNITY): Payer: Self-pay

## 2023-08-14 ENCOUNTER — Telehealth: Payer: Commercial Managed Care - PPO | Admitting: Physician Assistant

## 2023-08-14 ENCOUNTER — Telehealth: Payer: Commercial Managed Care - PPO

## 2023-08-14 ENCOUNTER — Ambulatory Visit (HOSPITAL_COMMUNITY)
Admission: EM | Admit: 2023-08-14 | Discharge: 2023-08-14 | Disposition: A | Discharge status: Home / Self Care | Payer: Commercial Managed Care - PPO | Attending: Emergency Medicine | Admitting: Emergency Medicine

## 2023-08-14 DIAGNOSIS — M545 Low back pain, unspecified: Secondary | ICD-10-CM

## 2023-08-14 DIAGNOSIS — M62838 Other muscle spasm: Secondary | ICD-10-CM

## 2023-08-14 DIAGNOSIS — S29012A Strain of muscle and tendon of back wall of thorax, initial encounter: Secondary | ICD-10-CM

## 2023-08-14 MED ORDER — NAPROXEN 500 MG PO TABS: 500.0000 mg | ORAL_TABLET | Freq: Two times a day (BID) | ORAL | 0 refills | Status: DC

## 2023-08-14 MED ORDER — TIZANIDINE HCL 4 MG PO TABS: 4.0000 mg | ORAL_TABLET | Freq: Three times a day (TID) | ORAL | 0 refills | Status: DC | PRN

## 2023-08-14 MED ORDER — PREDNISONE 20 MG PO TABS: 40.0000 mg | ORAL_TABLET | Freq: Every day | ORAL | 0 refills | Status: AC

## 2023-08-14 NOTE — Progress Notes (Signed)
Patient seen via e-visit and sent for in-person evaluation prior to video appt time.

## 2023-08-14 NOTE — Progress Notes (Signed)
Because you have been seen for back pain virtually in the past, and the pain has returned and worsened, I feel your condition warrants further evaluation and I recommend that you be seen in a face to face visit.  It may be necessary for you to have imaging at this point, and you should be assessed in person to assure you receive the best treatment plan    NOTE: There will be NO CHARGE for this eVisit   If you are having a true medical emergency please call 911.      For an urgent face to face visit, [facility] has eight urgent care centers for your convenience:   NEW!! [facility] at [facility] Get Driving Directions [phone] [address] [place], [address]    [facility] Health Urgent Care Center at [facility] Get Driving Directions [phone] [address] [place], [place] [address]   [facility] Health Urgent Care Center [facility]) Get Driving Directions [phone] [address] [place], [place] [address]  [facility] Health Urgent Care Center [facility] [place]) Get Driving Directions [phone] [address] [place],  [place]  [address]  [facility] Health Urgent Care Center [facility] - at [employer]  [phone] [address] [place],  [place] [address]   [facility] Health Urgent Care at [facility] Get Driving Directions [phone] [address] [place], [place] [address]   [facility] Health Urgent Care at [facility] Get Driving Directions  [phone] [address] [place], [place] [address]   [facility] Health Urgent Care at [facility] Directions [phone] [address] [place], [place] [address]  Your MyChart E-visit questionnaire answers were reviewed by a board certified advanced clinical practitioner to complete your personal care plan based on your specific symptoms.  Thank you for using  e-Visits.

## 2023-08-14 NOTE — ED Triage Notes (Signed)
Patient c/o posterior neck and left upper back pain x 2 days. Patient states pain is worse  when he lays down or trying to get up x 2 days. Patient states he feels "stiff"  Patient states he states he took Aspirin 2 days ago with no relief.

## 2023-08-14 NOTE — ED Provider Notes (Signed)
HPI  SUBJECTIVE:  Charles Schultz is a 42 y.o. male who presents with 5 days of left upper neck, back and lateral thoracic pain along the latissimus.  He describes pain as sharp, achy, stiff, with tender areas.  He has been working more recently.  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  He also had a slip and fall, where he caught himself on a rail with his left arm.  He denies direct trauma to his chest or back.  No fevers, numbness or tingling or arm weakness, chest pain, shortness of breath, cough, syncope, tearing sensation in the chest, abdominal pain.  He tried rest, Bayer back and body, Flexeril, IcyHot, ice and heat.  The Flexeril, ice and heat help.  Symptoms are worse with neck flexion, rotation lying down, raising his left arm, massage. He has had identical symptoms before back in June, had an e-visit, thought to have musculoskeletal back pain and was prescribed Naprosyn and Flexeril.  He states this worked well for him.  Past medical history negative for hypertension, aortic dissection, Marfan's or other connective tissue disease.  PCP: [facility].  Past Medical History:  Diagnosis Date   Sialadenitis     Past Surgical History:  Procedure Laterality Date   NO PAST SURGERIES     SUBMANDIBULAR GLAND EXCISION Left 12/02/2019   Procedure: EXCISION LEFT SUBMANDIBULAR GLAND;  Surgeon: Drema Halon, MD;  Location: [facility];  Service: ENT;  Laterality: Left;    Family History  Problem Relation Age of Onset   Diabetes Father    Prostate cancer Father     Social History   Tobacco Use   Smoking status: Never   Smokeless tobacco: Never   Tobacco comments:    still smoking Marijuana   Vaping Use   Vaping status: Never Used  Substance Use Topics   Alcohol use: Yes    Comment: occasionally    Drug use: Yes    Frequency: 3.0 times per week    Types: Marijuana    No current facility-administered medications  for this encounter.  Current Outpatient Medications:    naproxen (NAPROSYN) 500 MG tablet, Take 1 tablet (500 mg total) by mouth 2 (two) times daily., Disp: 20 tablet, Rfl: 0   predniSONE (DELTASONE) 20 MG tablet, Take 2 tablets (40 mg total) by mouth daily with breakfast for 5 days., Disp: 10 tablet, Rfl: 0   tiZANidine (ZANAFLEX) 4 MG tablet, Take 1 tablet (4 mg total) by mouth every 8 (eight) hours as needed for muscle spasms., Disp: 30 tablet, Rfl: 0   hydrocortisone (ANUSOL-HC) 25 MG suppository, Place 1 suppository (25 mg total) rectally 2 (two) times daily., Disp: 12 suppository, Rfl: 0   senna-docusate (SENOKOT-S) 8.6-50 MG tablet, Take 2 tablets by mouth daily., Disp: 60 tablet, Rfl: 1  No Known Allergies   ROS  As noted in HPI.   Physical Exam  BP 102/64 (BP Location: Left Arm)   Pulse (!) 52   Temp 98 F (36.7 C) (Oral)   Resp 14   SpO2 97%   Constitutional: Well developed, well nourished, no acute distress Eyes:  EOMI, conjunctiva normal bilaterally HENT: Normocephalic, atraumatic,mucus membranes moist.  No meningismus. Respiratory: Normal inspiratory effort Cardiovascular: Normal rate.  Radial pulse 2+ and equal bilaterally. GI: nondistended skin: No rash, skin intact Musculoskeletal: No C-spine, T-spine, L-spine tenderness.  Positive tenderness, spasm along the left trapezius and rhomboid.  Tenderness, spasm along the left latissimus.  No paralumbar tenderness.  Pain aggravated with pushing/rowing motion, torso rotation, head rotation, lateral neck bending. Neurologic: Alert & oriented x 3, no focal neuro deficits Psychiatric: Speech and behavior appropriate   ED Course   Medications - No data to display  No orders of the defined types were placed in this encounter.   No results found for this or any previous visit (from the past 24 hour(s)). No results found.  ED Clinical Impression  1. Trapezius muscle spasm   2. Upper back strain, initial encounter       ED Assessment/Plan    This appears to be very musculoskeletal as it is reproducible.  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  He has been working more frequently.  Doubt dissection.  Will send home with prednisone 40 mg for 5 days, Zanaflex, Naprosyn/Tylenol, and a work note.  Follow-up with PCP as needed.  ER return precautions given.  Discussed MDM, treatment plan, and plan for follow-up with patient. Discussed sn/sx that should prompt return to the ED. patient agrees with plan.   Meds ordered this encounter  Medications   naproxen (NAPROSYN) 500 MG tablet    Sig: Take 1 tablet (500 mg total) by mouth 2 (two) times daily.    Dispense:  20 tablet    Refill:  0   tiZANidine (ZANAFLEX) 4 MG tablet    Sig: Take 1 tablet (4 mg total) by mouth every 8 (eight) hours as needed for muscle spasms.    Dispense:  30 tablet    Refill:  0   predniSONE (DELTASONE) 20 MG tablet    Sig: Take 2 tablets (40 mg total) by mouth daily with breakfast for 5 days.    Dispense:  10 tablet    Refill:  0      *This clinic note was created using Scientist, clinical (histocompatibility and immunogenetics). Therefore, there may be occasional mistakes despite careful proofreading.  ?    Domenick Gong, MD 08/14/23 [phone]

## 2023-08-14 NOTE — Discharge Instructions (Signed)
This appears to be very musculoskeletal.  Take the Zanaflex as directed.  It should not make you sleepy as the Flexeril.  Take 500 mg of Naprosyn with 1000 mg of Tylenol twice a day.  May take an additional 1000 mg of Tylenol 1 more time a day.  Prednisone will help with pain and swelling.  Go to the ER for the signs and symptoms we discussed.

## 2023-09-02 ENCOUNTER — Telehealth: Payer: Commercial Managed Care - PPO | Admitting: Family Medicine

## 2023-09-02 ENCOUNTER — Telehealth: Payer: Commercial Managed Care - PPO | Admitting: Physician Assistant

## 2023-09-02 DIAGNOSIS — U071 COVID-19: Secondary | ICD-10-CM | POA: Diagnosis not present

## 2023-09-02 MED ORDER — FLUTICASONE PROPIONATE 50 MCG/ACT NA SUSP: 2.0000 | Freq: Every day | NASAL | 0 refills | Status: DC

## 2023-09-02 MED ORDER — BENZONATATE 100 MG PO CAPS: 100.0000 mg | ORAL_CAPSULE | Freq: Three times a day (TID) | ORAL | 0 refills | Status: DC | PRN

## 2023-09-02 MED ORDER — MOLNUPIRAVIR EUA 200MG CAPSULE: 4.0000 | ORAL_CAPSULE | Freq: Two times a day (BID) | ORAL | 0 refills | Status: AC

## 2023-09-02 NOTE — Patient Instructions (Signed)
Hortencia Conradi, thank you for joining Piedad Climes, PA-C for today's virtual visit.  While this provider is not your primary care provider (PCP), if your PCP is located in our provider database this encounter information will be shared with them immediately following your visit.   A [facility] MyChart account gives you access to today's visit and all your visits, tests, and labs performed at [facility] " click here if you don't have a [facility] MyChart account or go to [email or web address]  Consent: (Patient) Charles Schultz provided verbal consent for this virtual visit at the beginning of the encounter.  Current Medications: No current outpatient medications on file.   Medications ordered in this encounter:  No orders of the defined types were placed in this encounter.    *If you need refills on other medications prior to your next appointment, please contact your pharmacy*  Follow-Up: Call back or seek an in-person evaluation if the symptoms worsen or if the condition fails to improve as anticipated.  [facility] [phone]  Care Instructions: Please keep well-hydrated and get plenty of rest. Start a saline nasal rinse to flush out your nasal passages. You can use plain Mucinex to help thin congestion. If you have a humidifier, running in the bedroom at night. I want you to start OTC vitamin D3 1000 units daily, vitamin C 1000 mg daily, and a zinc supplement. Please take prescribed medications as directed.  Isolation Instructions: You are to isolate at home until you have been fever free for at least 24 hours without a fever-reducing medication, and symptoms have been steadily improving for 24 hours. At that time,  you can end isolation but need to mask for an additional 5 days.   If you must be around other household members who do not have symptoms, you need to make sure that both you and the family members are masking consistently  with a high-quality mask.  If you note any worsening of symptoms despite treatment, please seek an in-person evaluation ASAP. If you note any significant shortness of breath or any chest pain, please seek ER evaluation. Please do not delay care!   COVID-19: What to Do if You Are Sick If you test positive and are an older adult or someone who is at high risk of getting very sick from COVID-19, treatment may be available. Contact a healthcare provider right away after a positive test to determine if you are eligible, even if your symptoms are mild right now. You can also visit a Test to Treat location and, if eligible, receive a prescription from a provider. Don't delay: Treatment must be started within the first few days to be effective. If you have a fever, cough, or other symptoms, you might have COVID-19. Most people have mild illness and are able to recover at home. If you are sick: Keep track of your symptoms. If you have an emergency warning sign (including trouble breathing), call 911. Steps to help prevent the spread of COVID-19 if you are sick If you are sick with COVID-19 or think you might have COVID-19, follow the steps below to care for yourself and to help protect other people in your home and community. Stay home except to get medical care Stay home. Most people with COVID-19 have mild illness and can recover at home without medical care. Do not leave your home, except to get medical care. Do not visit public areas and do not go to places where you  are unable to wear a mask. Take care of yourself. Get rest and stay hydrated. Take over-the-counter medicines, such as acetaminophen, to help you feel better. Stay in touch with your doctor. Call before you get medical care. Be sure to get care if you have trouble breathing, or have any other emergency warning signs, or if you think it is an emergency. Avoid public transportation, ride-sharing, or taxis if possible. Get tested If you have  symptoms of COVID-19, get tested. While waiting for test results, stay away from others, including staying apart from those living in your household. Get tested as soon as possible after your symptoms start. Treatments may be available for people with COVID-19 who are at risk for becoming very sick. Don't delay: Treatment must be started early to be effective--some treatments must begin within 5 days of your first symptoms. Contact your healthcare provider right away if your test result is positive to determine if you are eligible. Self-tests are one of several options for testing for the virus that causes COVID-19 and may be more convenient than laboratory-based tests and point-of-care tests. Ask your healthcare provider or your local health department if you need help interpreting your test results. You can visit your state, tribal, local, and territorial health department's website to look for the latest local information on testing sites. Separate yourself from other people As much as possible, stay in a specific room and away from other people and pets in your home. If possible, you should use a separate bathroom. If you need to be around other people or animals in or outside of the home, wear a well-fitting mask. Tell your close contacts that they may have been exposed to COVID-19. An infected person can spread COVID-19 starting 48 hours (or 2 days) before the person has any symptoms or tests positive. By letting your close contacts know they may have been exposed to COVID-19, you are helping to protect everyone. See COVID-19 and Animals if you have questions about pets. If you are diagnosed with COVID-19, someone from the health department may call you. Answer the call to slow the spread. Monitor your symptoms Symptoms of COVID-19 include fever, cough, or other symptoms. Follow care instructions from your healthcare provider and local health department. Your local health authorities may give  instructions on checking your symptoms and reporting information. When to seek emergency medical attention Look for emergency warning signs* for COVID-19. If someone is showing any of these signs, seek emergency medical care immediately: Trouble breathing Persistent pain or pressure in the chest New confusion Inability to wake or stay awake Pale, gray, or blue-colored skin, lips, or nail beds, depending on skin tone *This list is not all possible symptoms. Please call your medical provider for any other symptoms that are severe or concerning to you. Call 911 or call ahead to your local emergency facility: Notify the operator that you are seeking care for someone who has or may have COVID-19. Call ahead before visiting your doctor Call ahead. Many medical visits for routine care are being postponed or done by phone or telemedicine. If you have a medical appointment that cannot be postponed, call your doctor's office, and tell them you have or may have COVID-19. This will help the office protect themselves and other patients. If you are sick, wear a well-fitting mask You should wear a mask if you must be around other people or animals, including pets (even at home). Wear a mask with the best fit, protection, and comfort for  you. You don't need to wear the mask if you are alone. If you can't put on a mask (because of trouble breathing, for example), cover your coughs and sneezes in some other way. Try to stay at least 6 feet away from other people. This will help protect the people around you. Masks should not be placed on young children under age 42 years, anyone who has trouble breathing, or anyone who is not able to remove the mask without help. Cover your coughs and sneezes Cover your mouth and nose with a tissue when you cough or sneeze. Throw away used tissues in a lined trash can. Immediately wash your hands with soap and water for at least 20 seconds. If soap and water are not available,  clean your hands with an alcohol-based hand sanitizer that contains at least 60% alcohol. Clean your hands often Wash your hands often with soap and water for at least 20 seconds. This is especially important after blowing your nose, coughing, or sneezing; going to the bathroom; and before eating or preparing food. Use hand sanitizer if soap and water are not available. Use an alcohol-based hand sanitizer with at least 60% alcohol, covering all surfaces of your hands and rubbing them together until they feel dry. Soap and water are the best option, especially if hands are visibly dirty. Avoid touching your eyes, nose, and mouth with unwashed hands. Handwashing Tips Avoid sharing personal household items Do not share dishes, drinking glasses, cups, eating utensils, towels, or bedding with other people in your home. Wash these items thoroughly after using them with soap and water or put in the dishwasher. Clean surfaces in your home regularly Clean and disinfect high-touch surfaces (for example, doorknobs, tables, handles, light switches, and countertops) in your "sick room" and bathroom. In shared spaces, you should clean and disinfect surfaces and items after each use by the person who is ill. If you are sick and cannot clean, a caregiver or other person should only clean and disinfect the area around you (such as your bedroom and bathroom) on an as needed basis. Your caregiver/other person should wait as long as possible (at least several hours) and wear a mask before entering, cleaning, and disinfecting shared spaces that you use. Clean and disinfect areas that may have blood, stool, or body fluids on them. Use household cleaners and disinfectants. Clean visible dirty surfaces with household cleaners containing soap or detergent. Then, use a household disinfectant. Use a product from [employer] List N: Disinfectants for Coronavirus (COVID-19). Be sure to follow the instructions on the label to ensure  safe and effective use of the product. Many products recommend keeping the surface wet with a disinfectant for a certain period of time (look at "contact time" on the product label). You may also need to wear personal protective equipment, such as gloves, depending on the directions on the product label. Immediately after disinfecting, wash your hands with soap and water for 20 seconds. For completed guidance on cleaning and disinfecting your home, visit Complete Disinfection Guidance. Take steps to improve ventilation at home Improve ventilation (air flow) at home to help prevent from spreading COVID-19 to other people in your household. Clear out COVID-19 virus particles in the air by opening windows, using air filters, and turning on fans in your home. Use this interactive tool to learn how to improve air flow in your home. When you can be around others after being sick with COVID-19 Deciding when you can be around others is different  for different situations. Find out when you can safely end home isolation. For any additional questions about your care, contact your healthcare provider or state or local health department. 03/19/2021 Content source: [facility] for Immunization and Respiratory Diseases ([facility]), Division of Viral Diseases This information is not intended to replace advice given to you by your health care provider. Make sure you discuss any questions you have with your health care provider. Document Revised: 05/02/2021 Document Reviewed: 05/02/2021 Elsevier Patient Education  2022 [employer].     If you have been instructed to have an in-person evaluation today at a local Urgent Care facility, please use the link below. It will take you to a list of all of our available [facility], including address, phone number and hours of operation. Please do not delay care.  [facility]  If you or a family member do not have a primary care provider, use  the link below to schedule a visit and establish care. When you choose a [facility] primary care physician or advanced practice provider, you gain a long-term partner in health. Find a Primary Care Provider  Learn more about [facility]'s in-office and virtual care options: [facility] - Get Care Now

## 2023-09-02 NOTE — Progress Notes (Signed)
Virtual Visit Consent   Charles Schultz, you are scheduled for a virtual visit with a [facility] provider today. Just as with appointments in the office, your consent must be obtained to participate. Your consent will be active for this visit and any virtual visit you may have with one of our providers in the next 365 days. If you have a MyChart account, a copy of this consent can be sent to you electronically.  As this is a virtual visit, video technology does not allow for your provider to perform a traditional examination. This may limit your provider's ability to fully assess your condition. If your provider identifies any concerns that need to be evaluated in person or the need to arrange testing (such as labs, EKG, etc.), we will make arrangements to do so. Although advances in technology are sophisticated, we cannot ensure that it will always work on either your end or our end. If the connection with a video visit is poor, the visit may have to be switched to a telephone visit. With either a video or telephone visit, we are not always able to ensure that we have a secure connection.  By engaging in this virtual visit, you consent to the provision of healthcare and authorize for your insurance to be billed (if applicable) for the services provided during this visit. Depending on your insurance coverage, you may receive a charge related to this service.  I need to obtain your verbal consent now. Are you willing to proceed with your visit today? Charles Schultz has provided verbal consent on 09/02/2023 for a virtual visit (video or telephone). Piedad Climes, [place]  Date: 09/02/2023 3:33 PM  Virtual Visit via Video Note   I, Piedad Climes, connected with  Charles Schultz  ([id number], June 29, 1981) on 09/02/23 at  3:30 PM EDT by a video-enabled telemedicine application and verified that I am speaking with the correct person using two identifiers.  Location: Patient: Virtual Visit Location  Patient: Home Provider: Virtual Visit Location Provider: Home Office   I discussed the limitations of evaluation and management by telemedicine and the availability of in person appointments. The patient expressed understanding and agreed to proceed.    History of Present Illness: Charles Schultz is a 42 y.o. who identifies as a male who was assigned male at birth, and is being seen today for COVID-19. Endorses symptoms starting within past 48 hours with aches, chills, fever, nasal/head congestion and sore throat. Denies chest pain or SOB. Took home COVID test earlier at [facility] provider recommendation, which came back positive. Wife with COVID last week.  OTC -- Mucinex, Nyquil, Ibuprofen  HPI: HPI  Problems:  Patient Active Problem List   Diagnosis Date Noted   Sialoadenitis 02/24/2018    Allergies: No Known Allergies Medications:  Current Outpatient Medications:    benzonatate (TESSALON) 100 MG capsule, Take 1 capsule (100 mg total) by mouth 3 (three) times daily as needed for cough., Disp: 30 capsule, Rfl: 0   fluticasone (FLONASE) 50 MCG/ACT nasal spray, Place 2 sprays into both nostrils daily., Disp: 16 g, Rfl: 0   molnupiravir EUA (LAGEVRIO) 200 mg CAPS capsule, Take 4 capsules (800 mg total) by mouth 2 (two) times daily for 5 days., Disp: 40 capsule, Rfl: 0  Observations/Objective: Patient is well-developed, well-nourished in no acute distress.  Resting comfortably at home.  Head is normocephalic, atraumatic.  No labored breathing. Speech is clear and coherent with logical content.  Patient is alert and  oriented at baseline.   Assessment and Plan: 1. COVID-19 - fluticasone (FLONASE) 50 MCG/ACT nasal spray; Place 2 sprays into both nostrils daily.  Dispense: 16 g; Refill: 0 - benzonatate (TESSALON) 100 MG capsule; Take 1 capsule (100 mg total) by mouth 3 (three) times daily as needed for cough.  Dispense: 30 capsule; Refill: 0 - molnupiravir EUA (LAGEVRIO) 200 mg CAPS capsule;  Take 4 capsules (800 mg total) by mouth 2 (two) times daily for 5 days.  Dispense: 40 capsule; Refill: 0  Patient with multiple risk factors for complicated course of illness. Discussed risks/benefits of antiviral medications including most common potential ADRs. Patient voiced understanding and would like to proceed with antiviral medication. They are candidate for Molnupiravir. Rx sent to pharmacy. Supportive measures, OTC medications and vitamin regimen reviewed. Tessalon and Flonase per orders. Quarantine reviewed in detail. Strict ER precautions discussed with patient.   Follow Up Instructions: I discussed the assessment and treatment plan with the patient. The patient was provided an opportunity to ask questions and all were answered. The patient agreed with the plan and demonstrated an understanding of the instructions.  A copy of instructions were sent to the patient via MyChart unless otherwise noted below.   The patient was advised to call back or seek an in-person evaluation if the symptoms worsen or if the condition fails to improve as anticipated.  Time:  I spent 10 minutes with the patient via telehealth technology discussing the above problems/concerns.    Piedad Climes, PA-C

## 2023-09-02 NOTE — Progress Notes (Signed)
Convert to VV 

## 2023-09-08 ENCOUNTER — Telehealth: Payer: Commercial Managed Care - PPO | Admitting: Physician Assistant

## 2023-09-08 ENCOUNTER — Encounter: Payer: Self-pay | Admitting: Physician Assistant

## 2023-09-08 DIAGNOSIS — U071 COVID-19: Secondary | ICD-10-CM

## 2023-09-08 DIAGNOSIS — B999 Unspecified infectious disease: Secondary | ICD-10-CM

## 2023-09-08 NOTE — Progress Notes (Signed)
Because of continued symptoms, especially fever, this many days after onset of symptoms and after completion of antivirals, I feel your condition warrants further evaluation and I recommend that you be seen in a face to face visit. You need a detailed lung exam to assess for any concerns for a secondary pneumonia and so proper treatment can be given.   NOTE: There will be NO CHARGE for this eVisit   If you are having a true medical emergency please call 911.      For an urgent face to face visit, [facility] has eight urgent care centers for your convenience:   NEW!! [facility] at [facility] Get Driving Directions [phone] [address], [address] [place], [address]    [facility] Urgent Care Center at [facility] Get Driving Directions [phone] [address] [place], [place] [address]   [facility] Health Urgent Care Center [facility]) Get Driving Directions [phone] [address] [place], [place] [address]  [facility]) Get Driving Directions [phone] [address] [place],  [place]  [address]  [facility] Urgent Care Center [facility] - at [employer]  [phone] [address] [place],  [place] [address]   [facility] at [facility] Get Driving Directions [phone] [address] [place], [place] [address]   [facility] Urgent Care at [facility] Get Driving Directions  [phone] [address] [place], [place] [address]   [facility] Health Urgent Care at [facility] Directions [phone] [address], [address] [place], [place] [address]  Your MyChart E-visit questionnaire answers were reviewed by a board certified advanced clinical practitioner to complete your personal care plan based on your specific symptoms.  Thank you for using e-Visits.

## 2023-10-20 ENCOUNTER — Telehealth: Payer: Commercial Managed Care - PPO | Admitting: Physician Assistant

## 2023-10-20 DIAGNOSIS — K602 Anal fissure, unspecified: Secondary | ICD-10-CM | POA: Diagnosis not present

## 2023-10-20 MED ORDER — NITROGLYCERIN 0.4 % RE OINT: TOPICAL_OINTMENT | RECTAL | 0 refills | Status: DC

## 2023-10-20 NOTE — Patient Instructions (Signed)
Hortencia Conradi, thank you for joining Piedad Climes, PA-C for today's virtual visit.  While this provider is not your primary care provider (PCP), if your PCP is located in our provider database this encounter information will be shared with them immediately following your visit.   A [facility] MyChart account gives you access to today's visit and all your visits, tests, and labs performed at [facility] " click here if you don't have a [facility] MyChart account or go to [email or web address]  Consent: (Patient) Charles Schultz provided verbal consent for this virtual visit at the beginning of the encounter.  Current Medications:  Current Outpatient Medications:    benzonatate (TESSALON) 100 MG capsule, Take 1 capsule (100 mg total) by mouth 3 (three) times daily as needed for cough., Disp: 30 capsule, Rfl: 0   fluticasone (FLONASE) 50 MCG/ACT nasal spray, Place 2 sprays into both nostrils daily., Disp: 16 g, Rfl: 0   Medications ordered in this encounter:  No orders of the defined types were placed in this encounter.    *If you need refills on other medications prior to your next appointment, please contact your pharmacy*  Follow-Up: Call back or seek an in-person evaluation if the symptoms worsen or if the condition fails to improve as anticipated.  [facility] [phone]  Other Instructions Anal Fissure, Adult  An anal fissure is a small tear or crack in the tissue near the opening of the butt (anus). In most cases, bleeding from the tear or crack stops on its own within a few minutes. You may have bleeding each time you poop until the tear or crack heals. What are the causes? Passing a large or hard poop (stool). Having trouble pooping (constipation). Having watery poops (diarrhea). An inflammatory bowel disease, like Crohn's disease or ulcerative colitis. Childbirth. Infections. Anal sex. What are the signs or symptoms? Bleeding  from the butt. Small amounts of blood on your poop. The blood coats the outside of the poop. It is not mixed with the poop. Small amounts of blood on the toilet paper or in the toilet after you poop. Pain when you poop. Itching or irritation around your butt. How is this treated? Treatment may include: Making changes to what you eat and drink. This can help if you have trouble pooping. Taking fiber supplements. These can help make your poop soft. Taking warm water baths (sitz baths). These can help heal the tear. Using creams and ointments. If other treatments do not work, you may need: A shot near the tear or crack (botulinum injection). Surgery to fix the tear or crack. Follow these instructions at home: Medicines Take over-the-counter and prescription medicines only as told by your doctor. This includes creams and ointments that have medicine in them. Use medicines to make your poop soft as told by your doctor. Treating constipation You may need to take these actions to prevent or treat trouble pooping: Drink enough fluid to keep your pee (urine) pale yellow. Eat foods that are high in fiber. These include beans, whole grains, and fresh fruits and vegetables. Stay away from unripe bananas. Ripe bananas are a good choice. Limit foods that are high in fat and sugar. These include fried or sweet foods. Avoid dairy products. This includes milk.  General instructions  Keep the butt area clean and dry. Take a warm water bath as told by your doctor. Do not use soap. Contact a doctor if: You have more bleeding. You have a  fever. You have watery poop that is mixed with blood. Your pain does not go away. Your problems get worse. This information is not intended to replace advice given to you by your health care provider. Make sure you discuss any questions you have with your health care provider. Document Revised: 01/01/2023 Document Reviewed: 01/01/2023 Elsevier Patient Education   2024 Elsevier Inc.    If you have been instructed to have an in-person evaluation today at a local Urgent Care facility, please use the link below. It will take you to a list of all of our available [facility], including address, phone number and hours of operation. Please do not delay care.  [facility]  If you or a family member do not have a primary care provider, use the link below to schedule a visit and establish care. When you choose a [facility] primary care physician or advanced practice provider, you gain a long-term partner in health. Find a Primary Care Provider  Learn more about [facility]'s in-office and virtual care options: [facility] - Get Care Now

## 2023-10-20 NOTE — Progress Notes (Signed)
Virtual Visit Consent   Charles Schultz, you are scheduled for a virtual visit with a [facility] provider today. Just as with appointments in the office, your consent must be obtained to participate. Your consent will be active for this visit and any virtual visit you may have with one of our providers in the next 365 days. If you have a MyChart account, a copy of this consent can be sent to you electronically.  As this is a virtual visit, video technology does not allow for your provider to perform a traditional examination. This may limit your provider's ability to fully assess your condition. If your provider identifies any concerns that need to be evaluated in person or the need to arrange testing (such as labs, EKG, etc.), we will make arrangements to do so. Although advances in technology are sophisticated, we cannot ensure that it will always work on either your end or our end. If the connection with a video visit is poor, the visit may have to be switched to a telephone visit. With either a video or telephone visit, we are not always able to ensure that we have a secure connection.  By engaging in this virtual visit, you consent to the provision of healthcare and authorize for your insurance to be billed (if applicable) for the services provided during this visit. Depending on your insurance coverage, you may receive a charge related to this service.  I need to obtain your verbal consent now. Are you willing to proceed with your visit today? Charles Schultz has provided verbal consent on 10/20/2023 for a virtual visit (video or telephone). Piedad Climes, [place]  Date: 10/20/2023 6:06 PM  Virtual Visit via Video Note   I, Piedad Climes, connected with  Charles Schultz  ([id number], 04-25-1981) on 10/20/23 at  6:00 PM EDT by a video-enabled telemedicine application and verified that I am speaking with the correct person using two identifiers.  Location: Patient: Virtual Visit Location  Patient: Home Provider: Virtual Visit Location Provider: Home Office   I discussed the limitations of evaluation and management by telemedicine and the availability of in person appointments. The patient expressed understanding and agreed to proceed.    History of Present Illness: Charles Schultz is a 42 y.o. who identifies as a male who was assigned male at birth, and is being seen today for an issue this morning noting was hard time passing bowel movement and strained to pass it. Slight BRBPR and abrupt onset of rectal pain described as burning. Is still present now so he is trying not to sit. Denies abdominal pain, nausea or vomiting. Notes episodes where he will go from normal BM to harder stool. Is trying to watch diet but has not been doing as well lately.  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  Resting comfortably at home.  Head is normocephalic,  atraumatic.  No labored breathing. Speech is clear and coherent with logical content.  Patient is alert and oriented at baseline.   Assessment and Plan: 1. Anal fissure - Nitroglycerin 0.4 % OINT; Apply 1 inch (375 mg) ointment intra-anally every 12 hours for anal fissure  Dispense: 30 g; Refill: 0  Classic history and symptoms of anal fissure. Hemorrhoid less likely giving acute symptoms. Supportive measures reviewed including sitz  baths and OTC oral analgesics. Dietary recommendations given. Will start topical Nitroglycerin. In person PCP follow-up scheduled.   Follow Up Instructions: I discussed the assessment and treatment plan with the patient. The patient was provided an opportunity to ask questions and all were answered. The patient agreed with the plan and demonstrated an understanding of the instructions.  A copy of instructions were sent to the patient via MyChart unless otherwise noted below.   The patient was advised to call back or seek an in-person evaluation if the symptoms worsen or if the condition fails to improve as anticipated.    Piedad Climes, PA-C

## 2023-10-23 ENCOUNTER — Telehealth: Payer: Commercial Managed Care - PPO | Admitting: Nurse Practitioner

## 2023-10-23 DIAGNOSIS — K602 Anal fissure, unspecified: Secondary | ICD-10-CM | POA: Diagnosis not present

## 2023-10-23 NOTE — Progress Notes (Signed)
Virtual Visit Consent   Charles Schultz, you are scheduled for a virtual visit with a [facility] provider today. Just as with appointments in the office, your consent must be obtained to participate. Your consent will be active for this visit and any virtual visit you may have with one of our providers in the next 365 days. If you have a MyChart account, a copy of this consent can be sent to you electronically.  As this is a virtual visit, video technology does not allow for your provider to perform a traditional examination. This may limit your provider's ability to fully assess your condition. If your provider identifies any concerns that need to be evaluated in person or the need to arrange testing (such as labs, EKG, etc.), we will make arrangements to do so. Although advances in technology are sophisticated, we cannot ensure that it will always work on either your end or our end. If the connection with a video visit is poor, the visit may have to be switched to a telephone visit. With either a video or telephone visit, we are not always able to ensure that we have a secure connection.  By engaging in this virtual visit, you consent to the provision of healthcare and authorize for your insurance to be billed (if applicable) for the services provided during this visit. Depending on your insurance coverage, you may receive a charge related to this service.  I need to obtain your verbal consent now. Are you willing to proceed with your visit today? Charles Schultz has provided verbal consent on 10/23/2023 for a virtual visit (video or telephone). Charles Simas, FNP  Date: 10/23/2023 4:33 PM  Virtual Visit via Video Note   I, Charles Schultz, connected with  Charles Schultz  ([id number], September 25, 1981) on 10/23/23 at  4:45 PM EDT by a video-enabled telemedicine application and verified that I am speaking with the correct person using two identifiers.  Location: Patient: Virtual Visit Location Patient:  Home Provider: Virtual Visit Location Provider: Home Office   I discussed the limitations of evaluation and management by telemedicine and the availability of in person appointments. The patient expressed understanding and agreed to proceed.    History of Present Illness: Charles Schultz is a 42 y.o. who identifies as a male who was assigned male at birth, and is being seen today for ongoing issues with anal fissure.  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  Resting comfortably  at home.  Head is normocephalic, atraumatic.  No labored breathing.  Speech is clear and coherent with logical content.  Patient is alert and oriented at baseline.    Assessment and Plan:  1. Anal fissure Reassured patient on recovery process and when he needs to be seen if pain worsens, fever, or recurrence of bleeding    Otherwise continue treatment as directed  Follow up with PCP regarding management of recurrent episodes       Follow Up Instructions: I discussed the assessment and treatment plan with the patient. The patient was provided an opportunity to ask questions and all were answered. The patient agreed with the plan and demonstrated an understanding of the instructions.  A copy of instructions were sent to the patient via MyChart unless otherwise noted below.    The patient was advised to call back or seek an in-person evaluation if the symptoms worsen or if the condition fails to improve as anticipated.    Charles Simas, FNP

## 2023-11-10 ENCOUNTER — Ambulatory Visit: Payer: Commercial Managed Care - PPO | Attending: Nurse Practitioner | Admitting: Nurse Practitioner

## 2023-11-10 VITALS — BP 99/63 | HR 71 | Ht 67.0 in | Wt 129.0 lb

## 2023-11-10 DIAGNOSIS — E785 Hyperlipidemia, unspecified: Secondary | ICD-10-CM | POA: Diagnosis not present

## 2023-11-10 DIAGNOSIS — D649 Anemia, unspecified: Secondary | ICD-10-CM | POA: Diagnosis not present

## 2023-11-10 DIAGNOSIS — Z23 Encounter for immunization: Secondary | ICD-10-CM

## 2023-11-10 DIAGNOSIS — Z0000 Encounter for general adult medical examination without abnormal findings: Secondary | ICD-10-CM | POA: Diagnosis not present

## 2023-11-10 NOTE — Progress Notes (Signed)
Assessment & Plan:  Charles Schultz was seen today for annual exam.  Diagnoses and all orders for this visit:  Encounter for annual physical exam -     CMP14+EGFR -     CBC with Differential -     Lipid panel  Dyslipidemia, goal LDL below 100 -     Lipid panel  Anemia, unspecified type -     CBC with Differential  Encounter for immunization -     Flu vaccine trivalent PF, 6mos and older(Flulaval,Afluria,Fluarix,Fluzone)    Patient has been counseled on age-appropriate routine health concerns for screening and prevention. These are reviewed and up-to-date. Referrals have been placed accordingly. Immunizations are up-to-date or declined.    Subjective:   Chief Complaint  Patient presents with   Annual Exam    Charles Schultz 42 y.o. male presents to office today for annual physical exam  He has a past medical history of Sialadenitis.    Review of Systems  Constitutional:  Negative for fever, malaise/fatigue and weight loss.  HENT: Negative.  Negative for nosebleeds.   Eyes: Negative.  Negative for blurred vision, double vision and photophobia.  Respiratory: Negative.  Negative for cough and shortness of breath.   Cardiovascular: Negative.  Negative for chest pain, palpitations and leg swelling.  Gastrointestinal: Negative.  Negative for heartburn, nausea and vomiting.  Genitourinary: Negative.   Musculoskeletal: Negative.  Negative for myalgias.  Skin: Negative.   Neurological: Negative.  Negative for dizziness, focal weakness, seizures and headaches.  Endo/Heme/Allergies: Negative.   Psychiatric/Behavioral: Negative.  Negative for suicidal ideas.     Past Medical History:  Diagnosis Date   Sialadenitis     Past Surgical History:  Procedure Laterality Date   NO PAST SURGERIES     SUBMANDIBULAR GLAND EXCISION Left 12/02/2019   Procedure: EXCISION LEFT SUBMANDIBULAR GLAND;  Surgeon: Drema Halon, MD;  Location: [facility];  Service: ENT;   Laterality: Left;    Family History  Problem Relation Age of Onset   Diabetes Father    Prostate cancer Father     Social History Reviewed with no changes to be made today.   Outpatient Medications Prior to Visit  Medication Sig Dispense Refill   benzonatate (TESSALON) 100 MG capsule Take 1 capsule (100 mg total) by mouth 3 (three) times daily as needed for cough. (Patient not taking: Reported on 11/10/2023) 30 capsule 0   fluticasone (FLONASE) 50 MCG/ACT nasal spray Place 2 sprays into both nostrils daily. 16 g 0   Nitroglycerin 0.4 % OINT Apply 1 inch (375 mg) ointment intra-anally every 12 hours for anal fissure 30 g 0   No facility-administered medications prior to visit.    No Known Allergies     Objective:    BP 99/63 (BP Location: Left Arm, Patient Position: Sitting, Cuff Size: Normal)   Pulse 71   Ht 5\' 7"  (1.702 m)   Wt 129 lb (58.5 kg)   SpO2 100%   BMI 20.20 kg/m  Wt Readings from Last 3 Encounters:  11/10/23 129 lb (58.5 kg)  06/23/22 131 lb 9.6 oz (59.7 kg)  12/02/19 152 lb 12.5 oz (69.3 kg)    Physical Exam Constitutional:      Appearance: He is well-developed.  HENT:     Head: Normocephalic and atraumatic.     Right Ear: Hearing, tympanic membrane, ear canal and external ear normal.     Left Ear: Hearing, tympanic membrane, ear canal and external ear normal.  Nose: Nose normal. No mucosal edema or rhinorrhea.     Right Turbinates: Not enlarged.     Left Turbinates: Not enlarged.     Mouth/Throat:     Lips: Pink.     Mouth: Mucous membranes are moist.     Dentition: No gingival swelling, dental abscesses or gum lesions.     Pharynx: Uvula midline.     Tonsils: No tonsillar exudate. 1+ on the right. 1+ on the left.  Eyes:     General: Lids are normal. No scleral icterus.    Extraocular Movements: Extraocular movements intact.     Conjunctiva/sclera: Conjunctivae normal.     Pupils: Pupils are equal, round, and reactive to light.  Neck:      Thyroid: No thyromegaly.     Trachea: No tracheal deviation.  Cardiovascular:     Rate and Rhythm: Normal rate and regular rhythm.     Heart sounds: Normal heart sounds. No murmur heard.    No friction rub. No gallop.  Pulmonary:     Effort: Pulmonary effort is normal. No respiratory distress.     Breath sounds: Normal breath sounds. No wheezing or rales.  Chest:     Chest wall: No mass or tenderness.  Breasts:    Right: No inverted nipple, mass, nipple discharge, skin change or tenderness.     Left: No inverted nipple, mass, nipple discharge, skin change or tenderness.  Abdominal:     General: Bowel sounds are normal. There is no distension.     Palpations: Abdomen is soft. There is no mass.     Tenderness: There is no abdominal tenderness. There is no guarding or rebound.  Musculoskeletal:        General: No tenderness or deformity. Normal range of motion.     Cervical back: Normal range of motion and neck supple.  Lymphadenopathy:     Cervical: No cervical adenopathy.  Skin:    General: Skin is warm and dry.     Capillary Refill: Capillary refill takes less than 2 seconds.     Findings: No erythema.  Neurological:     Mental Status: He is alert and oriented to person, place, and time.     Cranial Nerves: No cranial nerve deficit.     Sensory: Sensation is intact.     Motor: No abnormal muscle tone.     Coordination: Coordination is intact. Coordination normal.     Gait: Gait is intact.     Deep Tendon Reflexes: Reflexes normal.     Reflex Scores:      Patellar reflexes are 1+ on the right side and 1+ on the left side. Psychiatric:        Attention and Perception: Attention normal.        Mood and Affect: Mood normal.        Speech: Speech normal.        Behavior: Behavior normal.        Thought Content: Thought content normal.        Judgment: Judgment normal.          Patient has been counseled extensively about nutrition and exercise as well as the importance of  adherence with medications and regular follow-up. The patient was given clear instructions to go to ER or return to medical center if symptoms don't improve, worsen or new problems develop. The patient verbalized understanding.   Follow-up: Return if symptoms worsen or fail to improve.   Claiborne Rigg, FNP-BC [place]  [facility] [place], [place] [phone]   11/10/2023, 10:48 AM

## 2023-11-11 LAB — CBC WITH DIFFERENTIAL/PLATELET
Basophils Absolute: 0.1 10*3/uL (ref 0.0–0.2)
Basos: 1 %
EOS (ABSOLUTE): 0.1 10*3/uL (ref 0.0–0.4)
Eos: 3 %
Hematocrit: 44 % (ref 37.5–51.0)
Hemoglobin: 14.5 g/dL (ref 13.0–17.7)
Immature Grans (Abs): 0 10*3/uL (ref 0.0–0.1)
Immature Granulocytes: 0 %
Lymphocytes Absolute: 2.4 10*3/uL (ref 0.7–3.1)
Lymphs: 50 %
MCH: 31.7 pg (ref 26.6–33.0)
MCHC: 33 g/dL (ref 31.5–35.7)
MCV: 96 fL (ref 79–97)
Monocytes Absolute: 0.9 10*3/uL (ref 0.1–0.9)
Monocytes: 17 %
Neutrophils Absolute: 1.4 10*3/uL (ref 1.4–7.0)
Neutrophils: 29 %
Platelets: 226 10*3/uL (ref 150–450)
RBC: 4.57 x10E6/uL (ref 4.14–5.80)
RDW: 13.1 % (ref 11.6–15.4)
WBC: 4.9 10*3/uL (ref 3.4–10.8)

## 2023-11-11 LAB — CMP14+EGFR
ALT: 12 [IU]/L (ref 0–44)
AST: 19 [IU]/L (ref 0–40)
Albumin: 4.7 g/dL (ref 4.1–5.1)
Alkaline Phosphatase: 44 [IU]/L (ref 44–121)
BUN/Creatinine Ratio: 15 (ref 9–20)
BUN: 17 mg/dL (ref 6–24)
Bilirubin Total: 0.3 mg/dL (ref 0.0–1.2)
CO2: 23 mmol/L (ref 20–29)
Calcium: 9.2 mg/dL (ref 8.7–10.2)
Chloride: 106 mmol/L (ref 96–106)
Creatinine, Ser: 1.15 mg/dL (ref 0.76–1.27)
Globulin, Total: 2.7 g/dL (ref 1.5–4.5)
Glucose: 78 mg/dL (ref 70–99)
Potassium: 4.4 mmol/L (ref 3.5–5.2)
Sodium: 142 mmol/L (ref 134–144)
Total Protein: 7.4 g/dL (ref 6.0–8.5)
eGFR: 81 mL/min/{1.73_m2} (ref >59)

## 2023-11-11 LAB — LIPID PANEL
Chol/HDL Ratio: 3.1 ratio (ref 0.0–5.0)
Cholesterol, Total: 175 mg/dL (ref 100–199)
HDL: 57 mg/dL (ref >39)
LDL Chol Calc (NIH): 105 mg/dL — ABNORMAL HIGH (ref 0–99)
Triglycerides: 70 mg/dL (ref 0–149)
VLDL Cholesterol Cal: 13 mg/dL (ref 5–40)

## 2023-11-16 ENCOUNTER — Telehealth: Payer: Commercial Managed Care - PPO | Admitting: Physician Assistant

## 2023-11-16 DIAGNOSIS — K649 Unspecified hemorrhoids: Secondary | ICD-10-CM

## 2023-11-16 MED ORDER — HYDROCORTISONE ACETATE 25 MG RE SUPP: 25.0000 mg | Freq: Two times a day (BID) | RECTAL | 0 refills | Status: DC

## 2023-11-16 NOTE — Progress Notes (Signed)

## 2023-11-17 ENCOUNTER — Telehealth: Payer: Commercial Managed Care - PPO | Admitting: Physician Assistant

## 2023-11-17 DIAGNOSIS — K649 Unspecified hemorrhoids: Secondary | ICD-10-CM

## 2023-11-17 NOTE — Progress Notes (Signed)
Pt requested a note for work. Reviewed questionnaire and evisit from yesterday. A note work excuse note sent to patient's chart as requested.  I have spent 5 minutes in review of e-visit questionnaire, review and updating patient chart, medical decision making and response to patient.   Gilberto Better, PA-C

## 2024-07-06 ENCOUNTER — Telehealth: Admitting: Family Medicine

## 2024-07-06 ENCOUNTER — Telehealth

## 2024-07-06 DIAGNOSIS — K649 Unspecified hemorrhoids: Secondary | ICD-10-CM

## 2024-07-06 DIAGNOSIS — K5904 Chronic idiopathic constipation: Secondary | ICD-10-CM

## 2024-07-06 MED ORDER — HYDROCORTISONE ACETATE 25 MG RE SUPP: 25.0000 mg | Freq: Two times a day (BID) | RECTAL | 0 refills | Status: AC

## 2024-07-06 NOTE — Progress Notes (Signed)
 Virtual Visit Consent   Charles Schultz, you are scheduled for a virtual visit with a [facility] provider today. Just as with appointments in the office, your consent must be obtained to participate. Your consent will be active for this visit and any virtual visit you may have with one of our providers in the next 365 days. If you have a MyChart account, a copy of this consent can be sent to you electronically.  As this is a virtual visit, video technology does not allow for your provider to perform a traditional examination. This may limit your provider's ability to fully assess your condition. If your provider identifies any concerns that need to be evaluated in person or the need to arrange testing (such as labs, EKG, etc.), we will make arrangements to do so. Although advances in technology are sophisticated, we cannot ensure that it will always work on either your end or our end. If the connection with a video visit is poor, the visit may have to be switched to a telephone visit. With either a video or telephone visit, we are not always able to ensure that we have a secure connection.  By engaging in this virtual visit, you consent to the provision of healthcare and authorize for your insurance to be billed (if applicable) for the services provided during this visit. Depending on your insurance coverage, you may receive a charge related to this service.  I need to obtain your verbal consent now. Are you willing to proceed with your visit today? Charles Schultz has provided verbal consent on 07/06/2024 for a virtual visit (video or telephone). [name], FNP  Date: 07/06/2024 1:38 PM   Virtual Visit via Video Note   I, [name], connected with  Charles Schultz  ([id number], [birth year]) on 07/06/24 at  1:30 PM EDT by a video-enabled telemedicine application and verified that I am speaking with the correct person using two identifiers.  Location: Patient: Virtual Visit Location Patient:  Home Provider: Virtual Visit Location Provider: Home Office   I discussed the limitations of evaluation and management by telemedicine and the availability of in person appointments. The patient expressed understanding and agreed to proceed.    History of Present Illness: Charles Schultz is a 43 y.o. who identifies as a male who was assigned male at birth, and is being seen today for constipation and hemorrhoids. Estimated hard stools 6/10 times. Having a bowel movement 1-2 times a day. Tries not to strain. Symptoms intermittent for last 2 years. Strained on Monday. Pain worse on Monday and Tuesday causing him to miss work. Epsom salt bath twice over last two nights. Avoiding milk since this is a trigger for him. Drinks a lot but also sweats a lot. No blood in stools. Feels swollen around rectum.   HPI:  Problems:  Patient Active Problem List   Diagnosis Date Noted   Sialoadenitis 02/24/2018    Allergies: No Known Allergies Medications:  Current Outpatient Medications:    hydrocortisone  (ANUSOL -HC) 25 MG suppository, Place 1 suppository (25 mg total) rectally 2 (two) times daily., Disp: 12 suppository, Rfl: 0  Observations/Objective: Patient is well-developed, well-nourished in no acute distress.  Resting comfortably at home.  Head is normocephalic, atraumatic.  No labored breathing.  Speech is clear and coherent with logical content.  Patient is alert and oriented at baseline.   Assessment and Plan: 1. Hemorrhoids, unspecified hemorrhoid type - hydrocortisone  (ANUSOL -HC) 25 MG suppository; Place 1 suppository (25 mg total)  rectally 2 (two) times daily.  Dispense: 12 suppository; Refill: 0  2. Chronic idiopathic constipation (Primary)  Sitz bath as needed Stay well hydrated, minimum of 64oz of fluids daily Start hydrocortisone  suppositories today Discussed symptoms should improve over the next several days. The best treatment for hemorrhoids is actually prevention and working on  dietary changes to manage constipation. Discussed avoidance of milk as this is a trigger for you. Discussed adding fiber supplement to diet such as Metamucil or Benefiber starting with 1 scoop daily and titrating up or down to allow you to have soft bowel movements every time. We also discussed if stools are very hard or painful utilizing a glycerin suppository at home  Follow Up Instructions: I discussed the assessment and treatment plan with the patient. The patient was provided an opportunity to ask questions and all were answered. The patient agreed with the plan and demonstrated an understanding of the instructions.  A copy of instructions were sent to the patient via MyChart unless otherwise noted below.   The patient was advised to call back or seek an in-person evaluation if the symptoms worsen or if the condition fails to improve as anticipated.  I have sent a work note to Pharmacologist. You can find by going to the Menu on your homepage, scrolling down to the Communications section, and selecting Letters. Let us  know if you have any issue locating. Take care and feel better soon!  [name], FNP

## 2024-07-06 NOTE — Patient Instructions (Addendum)
  Charles Schultz Mae, thank you for joining Olam Charles Darby, FNP for today's virtual visit.  While this provider is not your primary care provider (PCP), if your PCP is located in our provider database this encounter information will be shared with them immediately following your visit.   A [facility] MyChart account gives you access to today's visit and all your visits, tests, and labs performed at [facility]  click here if you don't have a [name] account or go to [email or web address]  Consent: (Patient) Charles Schultz provided verbal consent for this virtual visit at the beginning of the encounter.  Current Medications:  Current Outpatient Medications:    hydrocortisone  (ANUSOL -HC) 25 MG suppository, Place 1 suppository (25 mg total) rectally 2 (two) times daily., Disp: 12 suppository, Rfl: 0   Medications ordered in this encounter:  Meds ordered this encounter  Medications   hydrocortisone  (ANUSOL -HC) 25 MG suppository    Sig: Place 1 suppository (25 mg total) rectally 2 (two) times daily.    Dispense:  12 suppository    Refill:  0    Supervising Provider:   BLAISE ALEENE Schultz [id number]    I have sent a work note to your MyChart. You can find by going to the Menu on your homepage, scrolling down to the Communications section, and selecting Letters. Let us  know if you have any issue locating. Take care and feel better soon!  *If you need refills on other medications prior to your next appointment, please contact your pharmacy*  Follow-Up: Call back or seek an in-person evaluation if the symptoms worsen or if the condition fails to improve as anticipated.  [facility] [phone]  Other Instructions Sitz bath as needed Stay well hydrated, minimum of 64oz of fluids daily Start hydrocortisone  suppositories today Discussed symptoms should improve over the next several days. The best treatment for hemorrhoids is actually prevention and working  on dietary changes to manage constipation. Discussed avoidance of milk as this is a trigger for you. Discussed adding fiber supplement to diet such as Metamucil or Benefiber starting with 1 scoop daily and titrating up or down to allow you to have soft bowel movements every time. We also discussed if stools are very hard or painful utilizing a glycerin suppository at home   If you have been instructed to have an in-person evaluation today at a local Urgent Care facility, please use the link below. It will take you to a list of all of our available [facility], including address, phone number and hours of operation. Please do not delay care.  [facility]  If you or a family member do not have a primary care provider, use the link below to schedule a visit and establish care. When you choose a [facility] primary care physician or advanced practice provider, you gain a long-term partner in health. Find a Primary Care Provider  Learn more about [facility] in-office and virtual care options:  - Get Care Now

## 2024-07-07 ENCOUNTER — Encounter: Payer: Self-pay | Admitting: Family Medicine

## 2024-07-07 NOTE — Progress Notes (Signed)
 Please refer to the treatment ordered on the EVisit yesterday.  You will not be charged for this duplicate one.

## 2024-08-25 ENCOUNTER — Telehealth: Payer: Self-pay | Admitting: Physician Assistant

## 2024-08-25 DIAGNOSIS — A084 Viral intestinal infection, unspecified: Secondary | ICD-10-CM

## 2024-08-25 MED ORDER — ONDANSETRON 4 MG PO TBDP: 4.0000 mg | ORAL_TABLET | Freq: Three times a day (TID) | ORAL | 0 refills | Status: AC | PRN

## 2024-08-25 NOTE — Progress Notes (Signed)
 E-Visit for Diarrhea  We are sorry that you are not feeling well.  Here is how we plan to help!  Based on what you have shared with me it looks like you have Acute Infectious Diarrhea.  Most cases of acute diarrhea are due to infections with virus and bacteria and are self-limited conditions lasting less than 14 days.  For your symptoms you may take Imodium 2 mg tablets that are over the counter at your local pharmacy. Take two tablet now and then one after each loose stool up to 6 a day.  Antibiotics are not needed for most people with diarrhea.  I have prescribed  Zofran 4 mg 1 tablet every 8 hours as needed for nausea and vomiting  HOME CARE We recommend changing your diet to help with your symptoms for the next few days. Drink plenty of fluids that contain water salt and sugar. Sports drinks such as Gatorade may help.  You may try broths, soups, bananas, applesauce, soft breads, mashed potatoes or crackers.  You are considered infectious for as long as the diarrhea continues. Hand washing or use of alcohol based hand sanitizers is recommend. It is best to stay out of work or school until your symptoms stop.   GET HELP RIGHT AWAY If you have dark yellow colored urine or do not pass urine frequently you should drink more fluids.   If your symptoms worsen  If you feel like you are going to pass out (faint) You have a new problem  MAKE SURE YOU  Understand these instructions. Will watch your condition. Will get help right away if you are not doing well or get worse.  Thank you for choosing an e-visit.  Your e-visit answers were reviewed by a board certified advanced clinical practitioner to complete your personal care plan. Depending upon the condition, your plan could have included both over the counter or prescription medications.  Please review your pharmacy choice. Make sure the pharmacy is open so you can pick up prescription now. If there is a problem, you may contact your  provider through [employer] and have the prescription routed to another pharmacy.  Your safety is important to [place]. If you have drug allergies check your prescription carefully.   For the next 24 hours you can use MyChart to ask questions about today's visit, request a non-urgent call back, or ask for a work or school excuse. You will get an email in the next two days asking about your experience. I hope that your e-visit has been valuable and will speed your recovery.

## 2024-08-25 NOTE — Progress Notes (Signed)
 Message sent to patient requesting further input regarding current symptoms. Awaiting patient response.

## 2024-08-25 NOTE — Progress Notes (Signed)
 I have spent 5 minutes in review of e-visit questionnaire, review and updating patient chart, medical decision making and response to patient.   Elsie Velma Lunger, PA-C

## 2024-08-27 ENCOUNTER — Telehealth: Payer: Self-pay | Admitting: Emergency Medicine

## 2024-08-27 DIAGNOSIS — R197 Diarrhea, unspecified: Secondary | ICD-10-CM

## 2024-08-27 NOTE — Progress Notes (Signed)
 E-Visit for Diarrhea  We are sorry that you are not feeling well.  Here is how we plan to help!  Based on what you have shared with me it looks like you have Acute Infectious Diarrhea and it sounds like you are getting better.   I recommend being careful with what you eat for the next few days to give your digestive tract time to recover. I suggest starting with simple foods  like soup, gelatin, and ice pops. If this goes well, eat other soft, bland foods that are high in carbohydrates (carbs), like bread or saltine crackers, as they can help settle the stomach. Other good foods to eat are noodles, rice, oatmeal, soup, soft vegetables, fruits, or lean meats.  Most cases of acute diarrhea are due to infections with virus and bacteria and are self-limited conditions lasting less than 14 days. You will to get checked in person if your diarrhea continues for 14 days or more.    HOME CARE We recommend changing your diet to help with your symptoms for the next few days. Drink plenty of fluids that contain water salt and sugar. Sports drinks such as Gatorade may help.  You may try broths, soups, bananas, applesauce, soft breads, mashed potatoes or crackers.  You are considered infectious for as long as the diarrhea continues. Hand washing or use of alcohol based hand sanitizers is recommend. It is best to stay out of work or school until your symptoms stop.   GET HELP RIGHT AWAY If you have dark yellow colored urine or do not pass urine frequently you should drink more fluids.   If your symptoms worsen  If you feel like you are going to pass out (faint) You have a new problem  MAKE SURE YOU  Understand these instructions. Will watch your condition. Will get help right away if you are not doing well or get worse.  Thank you for choosing an e-visit.  Your e-visit answers were reviewed by a board certified advanced clinical practitioner to complete your personal care plan. Depending upon the  condition, your plan could have included both over the counter or prescription medications.  Please review your pharmacy choice. Make sure the pharmacy is open so you can pick up prescription now. If there is a problem, you may contact your provider through [employer] and have the prescription routed to another pharmacy.  Your safety is important to us . If you have drug allergies check your prescription carefully.   For the next 24 hours you can use MyChart to ask questions about today's visit, request a non-urgent call back, or ask for a work or school excuse. You will get an email in the next two days asking about your experience. I hope that your e-visit has been valuable and will speed your recovery.  I have spent 5 minutes in review of e-visit questionnaire, review and updating patient chart, medical decision making and response to patient.   Jon Belt, PhD, FNP-BC

## 2024-11-11 ENCOUNTER — Encounter: Payer: Self-pay | Admitting: Nurse Practitioner

## 2024-11-16 ENCOUNTER — Telehealth: Payer: Self-pay | Admitting: Nurse Practitioner

## 2024-11-16 NOTE — Telephone Encounter (Signed)
 PT UNCONFIRMED APPT (PER VR VM FULL)

## 2024-11-18 ENCOUNTER — Ambulatory Visit: Payer: Self-pay | Admitting: Nurse Practitioner

## 2024-12-09 ENCOUNTER — Telehealth: Payer: Self-pay | Admitting: Family Medicine

## 2024-12-09 DIAGNOSIS — R197 Diarrhea, unspecified: Secondary | ICD-10-CM

## 2024-12-09 DIAGNOSIS — J069 Acute upper respiratory infection, unspecified: Secondary | ICD-10-CM

## 2024-12-09 MED ORDER — PROMETHAZINE-DM 6.25-15 MG/5ML PO SYRP: 5.0000 mL | ORAL_SOLUTION | Freq: Four times a day (QID) | ORAL | 0 refills | Status: AC | PRN

## 2024-12-09 NOTE — Patient Instructions (Signed)
 Diarrhea, Adult Diarrhea is frequent loose and sometimes watery bowel movements. Diarrhea can make you feel weak and cause you to become dehydrated. Dehydration is a condition in which there is not enough water or other fluids in the body. Dehydration can make you tired and thirsty, cause you to have a dry mouth, and decrease how often you urinate. Diarrhea typically lasts 2-3 days. However, it can last longer if it is a sign of something more serious. It is important to treat your diarrhea as told by your health care provider. Follow these instructions at home: Eating and drinking     Follow these recommendations as told by your health care provider: Take an oral rehydration solution (ORS). This is an over-the-counter medicine that helps return your body to its normal balance of nutrients and water. It is found at pharmacies and retail stores. Drink enough fluid to keep your urine pale yellow. Drink fluids such as water, diluted fruit juice, and low-calorie sports drinks. You can drink milk also, if desired. Sucking on ice chips is another way to get fluids. Avoid drinking fluids that contain a lot of sugar or caffeine, such as soda, energy drinks, and regular sports drinks. Avoid alcohol. Eat bland, easy-to-digest foods in small amounts as you are able. These foods include bananas, applesauce, rice, lean meats, toast, and crackers. Avoid spicy or fatty foods.  Medicines Take over-the-counter and prescription medicines only as told by your health care provider. If you were prescribed antibiotics, take them as told by your health care provider. Do not stop using the antibiotic even if you start to feel better. General instructions  Wash your hands often using soap and water for at least 20 seconds. If soap and water are not available, use hand sanitizer. Others in the household should wash their hands as well. Hands should be washed: After using the toilet or changing a diaper. Before  preparing, cooking, or serving food. While caring for a sick person or while visiting someone in a hospital. Rest at home while you recover. Take a warm bath to relieve any burning or pain from frequent diarrhea episodes. Watch your condition for any changes. Contact a health care provider if: You have a fever. Your diarrhea gets worse. You have new symptoms. You vomit every time you eat or drink. You feel light-headed, dizzy, or have a headache. You have muscle cramps. You have signs of dehydration, such as: Dark urine, very little urine, or no urine. Cracked lips. Dry mouth. Sunken eyes. Sleepiness. Weakness. You have bloody or black stools or stools that look like tar. You have severe pain, cramping, or bloating in your abdomen. Your skin feels cold and clammy. You feel confused. Get help right away if: You have chest pain or your heart is beating very quickly. You have trouble breathing or you are breathing very quickly. You feel extremely weak or you faint. These symptoms may be an emergency. Get help right away. Call 911. Do not wait to see if the symptoms will go away. Do not drive yourself to the hospital. This information is not intended to replace advice given to you by your health care provider. Make sure you discuss any questions you have with your health care provider. Document Revised: 06/03/2022 Document Reviewed: 06/03/2022 Elsevier Patient Education  2024 [employer]Upper Respiratory Infection, Adult An upper respiratory infection (URI) is a common viral infection of the nose, throat, and upper air passages that lead to the lungs. The most common type of URI is  the common cold. URIs usually get better on their own, without medical treatment. What are the causes? A URI is caused by a virus. You may catch a virus by: Breathing in droplets from an infected person's cough or sneeze. Touching something that has been exposed to the virus (is contaminated) and then  touching your mouth, nose, or eyes. What increases the risk? You are more likely to get a URI if: You are very young or very old. You have close contact with others, such as at work, school, or a health care facility. You smoke. You have long-term (chronic) heart or lung disease. You have a weakened disease-fighting system (immune system). You have nasal allergies or asthma. You are experiencing a lot of stress. You have poor nutrition. What are the signs or symptoms? A URI usually involves some of the following symptoms: Runny or stuffy (congested) nose. Cough. Sneezing. Sore throat. Headache. Fatigue. Fever. Loss of appetite. Pain in your forehead, behind your eyes, and over your cheekbones (sinus pain). Muscle aches. Redness or irritation of the eyes. Pressure in the ears or face. How is this diagnosed? This condition may be diagnosed based on your medical history and symptoms, and a physical exam. Your health care provider may use a swab to take a mucus sample from your nose (nasal swab). This sample can be tested to determine what virus is causing the illness. How is this treated? URIs usually get better on their own within 7-10 days. Medicines cannot cure URIs, but your health care provider may recommend certain medicines to help relieve symptoms, such as: Over-the-counter cold medicines. Cough suppressants. Coughing is a type of defense against infection that helps to clear the respiratory system, so take these medicines only as recommended by your health care provider. Fever-reducing medicines. Follow these instructions at home: Activity Rest as needed. If you have a fever, stay home from work or school until your fever is gone or until your health care provider says your URI cannot spread to other people (is no longer contagious). Your health care provider may have you wear a face mask to prevent your infection from spreading. Relieving symptoms Gargle with a mixture of  salt and water 3-4 times a day or as needed. To make salt water, completely dissolve -1 tsp (3-6 g) of salt in 1 cup (237 mL) of warm water. Use a cool-mist humidifier to add moisture to the air. This can help you breathe more easily. Eating and drinking  Drink enough fluid to keep your urine pale yellow. Eat soups and other clear broths. General instructions  Take over-the-counter and prescription medicines only as told by your health care provider. These include cold medicines, fever reducers, and cough suppressants. Do not use any products that contain nicotine or tobacco. These products include cigarettes, chewing tobacco, and vaping devices, such as e-cigarettes. If you need help quitting, ask your health care provider. Stay away from secondhand smoke. Stay up to date on all immunizations, including the yearly (annual) flu vaccine. Keep all follow-up visits. This is important. How to prevent the spread of infection to others URIs can be contagious. To prevent the infection from spreading: Wash your hands with soap and water for at least 20 seconds. If soap and water are not available, use hand sanitizer. Avoid touching your mouth, face, eyes, or nose. Cough or sneeze into a tissue or your sleeve or elbow instead of into your hand or into the air.  Contact a health care provider if: You are  getting worse instead of better. You have a fever or chills. Your mucus is brown or red. You have yellow or brown discharge coming from your nose. You have pain in your face, especially when you bend forward. You have swollen neck glands. You have pain while swallowing. You have white areas in the back of your throat. Get help right away if: You have shortness of breath that gets worse. You have severe or persistent: Headache. Ear pain. Sinus pain. Chest pain. You have chronic lung disease along with any of the following: Making high-pitched whistling sounds when you breathe, most often  when you breathe out (wheezing). Prolonged cough (more than 14 days). Coughing up blood. A change in your usual mucus. You have a stiff neck. You have changes in your: Vision. Hearing. Thinking. Mood. These symptoms may be an emergency. Get help right away. Call 911. Do not wait to see if the symptoms will go away. Do not drive yourself to the hospital. Summary An upper respiratory infection (URI) is a common infection of the nose, throat, and upper air passages that lead to the lungs. A URI is caused by a virus. URIs usually get better on their own within 7-10 days. Medicines cannot cure URIs, but your health care provider may recommend certain medicines to help relieve symptoms. This information is not intended to replace advice given to you by your health care provider. Make sure you discuss any questions you have with your health care provider. Document Revised: 07/17/2021 Document Reviewed: 07/17/2021 Elsevier Patient Education  2024 [employer].

## 2024-12-09 NOTE — Progress Notes (Signed)
 Virtual Visit Consent   Charles Schultz, you are scheduled for a virtual visit with a [facility] provider today. Just as with appointments in the office, your consent must be obtained to participate. Your consent will be active for this visit and any virtual visit you may have with one of our providers in the next 365 days. If you have a MyChart account, a copy of this consent can be sent to you electronically.  As this is a virtual visit, video technology does not allow for your provider to perform a traditional examination. This may limit your provider's ability to fully assess your condition. If your provider identifies any concerns that need to be evaluated in person or the need to arrange testing (such as labs, EKG, etc.), we will make arrangements to do so. Although advances in technology are sophisticated, we cannot ensure that it will always work on either your end or our end. If the connection with a video visit is poor, the visit may have to be switched to a telephone visit. With either a video or telephone visit, we are not always able to ensure that we have a secure connection.  By engaging in this virtual visit, you consent to the provision of healthcare and authorize for your insurance to be billed (if applicable) for the services provided during this visit. Depending on your insurance coverage, you may receive a charge related to this service.  I need to obtain your verbal consent now. Are you willing to proceed with your visit today? Charles Schultz has provided verbal consent on 12/09/2024 for a virtual visit (video or telephone). Charles Lamp, FNP  Date: 12/09/2024 10:43 AM   Virtual Visit via Video Note   I, Charles Schultz, connected with  Charles Schultz  ([id number], 02-24-1981) on 12/09/2024 at 11:15 AM EST by a video-enabled telemedicine application and verified that I am speaking with the correct person using two identifiers.  Location: Patient: Virtual Visit Location Patient:  Home Provider: Virtual Visit Location Provider: Home Office   I discussed the limitations of evaluation and management by telemedicine and the availability of in person appointments. The patient expressed understanding and agreed to proceed.    History of Present Illness: Charles Schultz is a 43 y.o. who identifies as a male who was assigned male at birth, and is being seen today for sx 1-2 days. Fever 102, cough, body aches, chills and headache. He also has diarrhea.   HPI: HPI  Problems:  Patient Active Problem List   Diagnosis Date Noted   Sialoadenitis 02/24/2018    Allergies: Allergies[1] Medications: Current Medications[2]  Observations/Objective: Patient is well-developed, well-nourished in no acute distress.  Resting comfortably  at home.  Head is normocephalic, atraumatic.  No labored breathing.  Speech is clear and coherent with logical content.  Patient is alert and oriented at baseline.    Assessment and Plan: 1. Viral URI with cough (Primary)  2. Diarrhea, unspecified type  Increase fluids, BRATT diet, no milk or dairy. Humidifier at night, UC if sx worsen. Advised to take at home flu and covid test.   Follow Up Instructions: I discussed the assessment and treatment plan with the patient. The patient was provided an opportunity to ask questions and all were answered. The patient agreed with the plan and demonstrated an understanding of the instructions.  A copy of instructions were sent to the patient via MyChart unless otherwise noted below.     The patient was advised to call  back or seek an in-person evaluation if the symptoms worsen or if the condition fails to improve as anticipated.    Nima Bamburg, FNP     [1] No Known Allergies [2]  Current Outpatient Medications:    promethazine-dextromethorphan (PROMETHAZINE-DM) 6.25-15 MG/5ML syrup, Take 5 mLs by mouth 4 (four) times daily as needed for up to 10 days for cough., Disp: 118 mL, Rfl: 0    hydrocortisone  (ANUSOL -HC) 25 MG suppository, Place 1 suppository (25 mg total) rectally 2 (two) times daily., Disp: 12 suppository, Rfl: 0   ondansetron  (ZOFRAN -ODT) 4 MG disintegrating tablet, Take 1 tablet (4 mg total) by mouth every 8 (eight) hours as needed for nausea or vomiting., Disp: 20 tablet, Rfl: 0

## 2024-12-11 ENCOUNTER — Telehealth: Payer: Self-pay | Admitting: Family Medicine

## 2024-12-11 DIAGNOSIS — R051 Acute cough: Secondary | ICD-10-CM

## 2024-12-11 DIAGNOSIS — J029 Acute pharyngitis, unspecified: Secondary | ICD-10-CM

## 2024-12-11 MED ORDER — AMOXICILLIN-POT CLAVULANATE 875-125 MG PO TABS: 1.0000 | ORAL_TABLET | Freq: Two times a day (BID) | ORAL | 0 refills | Status: AC

## 2024-12-11 NOTE — Patient Instructions (Signed)

## 2024-12-11 NOTE — Progress Notes (Signed)
 Virtual Visit Consent   Charles Schultz, you are scheduled for a virtual visit with a [facility] provider today. Just as with appointments in the office, your consent must be obtained to participate. Your consent will be active for this visit and any virtual visit you may have with one of our providers in the next 365 days. If you have a MyChart account, a copy of this consent can be sent to you electronically.  As this is a virtual visit, video technology does not allow for your provider to perform a traditional examination. This may limit your provider's ability to fully assess your condition. If your provider identifies any concerns that need to be evaluated in person or the need to arrange testing (such as labs, EKG, etc.), we will make arrangements to do so. Although advances in technology are sophisticated, we cannot ensure that it will always work on either your end or our end. If the connection with a video visit is poor, the visit may have to be switched to a telephone visit. With either a video or telephone visit, we are not always able to ensure that we have a secure connection.  By engaging in this virtual visit, you consent to the provision of healthcare and authorize for your insurance to be billed (if applicable) for the services provided during this visit. Depending on your insurance coverage, you may receive a charge related to this service.  I need to obtain your verbal consent now. Are you willing to proceed with your visit today? Charles Schultz has provided verbal consent on 12/11/2024 for a virtual visit (video or telephone). Loa Lamp, FNP  Date: 12/11/2024 3:14 PM   Virtual Visit via Video Note   I, Loa Lamp, connected with  Charles Schultz  ([id number], March 12, 1981) on 12/11/2024 at  3:30 PM EST by a video-enabled telemedicine application and verified that I am speaking with the correct person using two identifiers.  Location: Patient: Virtual Visit Location Patient:  Home Provider: Virtual Visit Location Provider: Home Office   I discussed the limitations of evaluation and management by telemedicine and the availability of in person appointments. The patient expressed understanding and agreed to proceed.    History of Present Illness: Charles Schultz is a 43 y.o. who identifies as a male who was assigned male at birth, and is being seen today for sore throat worsening, he is taking prometh  DM, cough improved, no wheezing or sob, body aches and chills. No flu or covid testing done.   HPI: HPI  Problems:  Patient Active Problem List   Diagnosis Date Noted   Sialoadenitis 02/24/2018    Allergies: Allergies[1] Medications: Current Medications[2]  Observations/Objective: Patient is well-developed, well-nourished in no acute distress.  Resting comfortably  at home.  Head is normocephalic, atraumatic.  No labored breathing.  Speech is clear and coherent with logical content.  Patient is alert and oriented at baseline.    Assessment and Plan: 1. Pharyngitis, unspecified etiology (Primary)  2. Acute cough  Increase fluids warm salt water gargles, ibuprofen  as directed, instructed to take in home flu and covid test. Requests work note. See previous visit.   Follow Up Instructions: I discussed the assessment and treatment plan with the patient. The patient was provided an opportunity to ask questions and all were answered. The patient agreed with the plan and demonstrated an understanding of the instructions.  A copy of instructions were sent to the patient via MyChart unless otherwise noted below.  The patient was advised to call back or seek an in-person evaluation if the symptoms worsen or if the condition fails to improve as anticipated.    Jamarea Selner, FNP     [1] No Known Allergies [2]  Current Outpatient Medications:    amoxicillin -clavulanate (AUGMENTIN ) 875-125 MG tablet, Take 1 tablet by mouth 2 (two) times daily for 10 days.,  Disp: 20 tablet, Rfl: 0   hydrocortisone  (ANUSOL -HC) 25 MG suppository, Place 1 suppository (25 mg total) rectally 2 (two) times daily., Disp: 12 suppository, Rfl: 0   ondansetron  (ZOFRAN -ODT) 4 MG disintegrating tablet, Take 1 tablet (4 mg total) by mouth every 8 (eight) hours as needed for nausea or vomiting., Disp: 20 tablet, Rfl: 0   promethazine -dextromethorphan (PROMETHAZINE -DM) 6.25-15 MG/5ML syrup, Take 5 mLs by mouth 4 (four) times daily as needed for up to 10 days for cough., Disp: 118 mL, Rfl: 0
